# Patient Record
Sex: Male | Born: 1960 | Race: Black or African American | Hispanic: No | Marital: Married | Smoking: Former smoker
Health system: Southern US, Community
[De-identification: ages and names within clinical notes are randomized; demographics above are authoritative.]

## PROBLEM LIST (undated history)

## (undated) DIAGNOSIS — K219 Gastro-esophageal reflux disease without esophagitis: Secondary | ICD-10-CM

## (undated) DIAGNOSIS — T7840XA Allergy, unspecified, initial encounter: Secondary | ICD-10-CM

## (undated) DIAGNOSIS — M79601 Pain in right arm: Secondary | ICD-10-CM

## (undated) DIAGNOSIS — I10 Essential (primary) hypertension: Secondary | ICD-10-CM

## (undated) DIAGNOSIS — E7800 Pure hypercholesterolemia, unspecified: Secondary | ICD-10-CM

## (undated) DIAGNOSIS — R51 Headache: Secondary | ICD-10-CM

## (undated) DIAGNOSIS — M549 Dorsalgia, unspecified: Secondary | ICD-10-CM

## (undated) DIAGNOSIS — M1990 Unspecified osteoarthritis, unspecified site: Secondary | ICD-10-CM

## (undated) HISTORY — DX: Allergy, unspecified, initial encounter: T78.40XA

## (undated) HISTORY — DX: Pure hypercholesterolemia, unspecified: E78.00

## (undated) HISTORY — DX: Gastro-esophageal reflux disease without esophagitis: K21.9

## (undated) HISTORY — PX: HAND ARTHROPLASTY: SHX968

---

## 2011-06-21 ENCOUNTER — Encounter: Payer: Self-pay | Admitting: Emergency Medicine

## 2011-06-21 ENCOUNTER — Emergency Department (HOSPITAL_COMMUNITY)
Admission: EM | Admit: 2011-06-21 | Discharge: 2011-06-22 | Disposition: A | Discharge status: Home / Self Care | Payer: Medicaid Other | Attending: Emergency Medicine | Admitting: Emergency Medicine

## 2011-06-21 DIAGNOSIS — M25519 Pain in unspecified shoulder: Secondary | ICD-10-CM | POA: Insufficient documentation

## 2011-06-21 DIAGNOSIS — Z9889 Other specified postprocedural states: Secondary | ICD-10-CM | POA: Insufficient documentation

## 2011-06-21 DIAGNOSIS — Z79899 Other long term (current) drug therapy: Secondary | ICD-10-CM | POA: Insufficient documentation

## 2011-06-21 DIAGNOSIS — F17200 Nicotine dependence, unspecified, uncomplicated: Secondary | ICD-10-CM | POA: Insufficient documentation

## 2011-06-21 NOTE — ED Notes (Signed)
PT. REPORTS RIGH SHOULDER PAIN /RIGHT UPPER BACK PAIN / RIGHT ARM PAIN ONSET YESTERDAY , DENIES INJURY OR FALL , PAIN WORSE WHEN MOVING AND CERTAIN POSITIONS.

## 2011-06-22 ENCOUNTER — Emergency Department (HOSPITAL_COMMUNITY): Payer: Medicaid Other

## 2011-06-22 MED ORDER — PREDNISONE 10 MG PO TABS: 20.0000 mg | ORAL_TABLET | Freq: Every day | ORAL | Status: AC

## 2011-06-22 MED ORDER — PREDNISONE 20 MG PO TABS
60.0000 mg | ORAL_TABLET | Freq: Once | ORAL | Status: AC
  Administered 2011-06-22: 60 mg via ORAL
  Filled 2011-06-22: qty 3

## 2011-06-22 MED ORDER — OXYCODONE-ACETAMINOPHEN 5-325 MG PO TABS
2.0000 | ORAL_TABLET | Freq: Once | ORAL | Status: AC
  Administered 2011-06-22: 2 via ORAL
  Filled 2011-06-22: qty 2

## 2011-06-22 MED ORDER — ONDANSETRON HCL 4 MG PO TABS: 4.0000 mg | ORAL_TABLET | Freq: Four times a day (QID) | ORAL | Status: AC

## 2011-06-22 MED ORDER — OXYCODONE-ACETAMINOPHEN 5-325 MG PO TABS: 1.0000 | ORAL_TABLET | Freq: Four times a day (QID) | ORAL | Status: AC | PRN

## 2011-06-22 NOTE — ED Provider Notes (Signed)
Medical screening examination/treatment/procedure(s) were performed by non-physician practitioner and as supervising physician I was immediately available for consultation/collaboration.   Gwyneth Sprout, MD 06/22/11 [phone]

## 2011-06-22 NOTE — ED Provider Notes (Signed)
History     CSN: [id number] Arrival date & time: 06/21/2011 11:03 PM   First MD Initiated Contact with Patient 06/22/11 0200      Chief Complaint  Patient presents with  . Shoulder Pain    (Consider location/radiation/quality/duration/timing/severity/associated sxs/prior treatment) The history is provided by the patient.   Pt presents to the ED with severe right shoulder pain. He denies injury, fall or anything else that may have caused this. He hurt his shoulder 6 months ago but it hadnt bothered him much since yesterday. Tonight while he was trying to sleep he began to have shooting pains going down him arm. Denies weakness   History reviewed. No pertinent past medical history.  Past Surgical History  Procedure Date  . Hand arthroplasty     No family history on file.  History  Substance Use Topics  . Smoking status: Current Everyday Smoker  . Smokeless tobacco: Not on file  . Alcohol Use: No      Review of Systems  All other systems reviewed and are negative.    Allergies  Review of patient's allergies indicates no known allergies.  Home Medications   Current Outpatient Rx  Name Route Sig Dispense Refill  . ESOMEPRAZOLE MAGNESIUM 40 MG PO CPDR Oral Take 40 mg by mouth daily before breakfast.        BP 153/101  Pulse 95  Temp(Src) 98.1 F (36.7 C) (Oral)  Resp 20  SpO2 97%  Physical Exam  Nursing note and vitals reviewed. Constitutional: He is oriented to person, place, and time. He appears well-developed and well-nourished.  HENT:  Head: Normocephalic and atraumatic.  Eyes: EOM are normal. Pupils are equal, round, and reactive to light.  Neck: Normal range of motion.  Cardiovascular: Normal rate and regular rhythm.   Pulmonary/Chest: Effort normal and breath sounds normal.  Musculoskeletal: Normal range of motion.       Arms: Neurological: He is alert and oriented to person, place, and time.  Skin: Skin is warm and dry.    ED Course    Procedures (including critical care time)  Labs Reviewed - No data to display Dg Shoulder Right  06/22/2011  *RADIOLOGY REPORT*  Clinical Data: Right shoulder pain.  RIGHT SHOULDER - 2+ VIEW  Comparison: None.  Findings: Mild acromioclavicular DJD.  No acute fracture or dislocation.  Unremarkable right upper ribs, scapula, and clavicle. Right upper lung is clear.  IMPRESSION: No acute osseous abnormality.  Mild acromioclavicular DJD.  Original Report Authenticated By: Waneta Martins, M.D.     No diagnosis found.    MDM   Pt symptoms are consistent with with swelling causisng shooting nerve pains. Will treat with steroids and pain medication and make sure he follows-up with Ortho.       Dorthula Matas, PA 06/22/11 [phone]

## 2011-07-22 ENCOUNTER — Other Ambulatory Visit: Payer: Self-pay | Admitting: Orthopedic Surgery

## 2011-07-25 ENCOUNTER — Encounter (HOSPITAL_COMMUNITY): Payer: Self-pay | Admitting: Pharmacy Technician

## 2011-07-27 ENCOUNTER — Encounter (HOSPITAL_COMMUNITY): Payer: Self-pay

## 2011-07-27 ENCOUNTER — Encounter (HOSPITAL_COMMUNITY)
Admission: RE | Admit: 2011-07-27 | Discharge: 2011-07-27 | Disposition: A | Discharge status: Home / Self Care | Payer: Medicaid Other | Source: Ambulatory Visit | Attending: Orthopedic Surgery | Admitting: Orthopedic Surgery

## 2011-07-27 ENCOUNTER — Other Ambulatory Visit: Payer: Self-pay

## 2011-07-27 HISTORY — DX: Pain in right arm: M79.601

## 2011-07-27 HISTORY — DX: Headache: R51

## 2011-07-27 HISTORY — DX: Dorsalgia, unspecified: M54.9

## 2011-07-27 HISTORY — DX: Unspecified osteoarthritis, unspecified site: M19.90

## 2011-07-27 LAB — URINALYSIS, ROUTINE W REFLEX MICROSCOPIC
Bilirubin Urine: NEGATIVE
Glucose, UA: NEGATIVE mg/dL
Hgb urine dipstick: NEGATIVE
Ketones, ur: NEGATIVE mg/dL
Leukocytes, UA: NEGATIVE
Nitrite: NEGATIVE
Protein, ur: NEGATIVE mg/dL
Specific Gravity, Urine: 1.024 (ref 1.005–1.030)
Urobilinogen, UA: 1 mg/dL (ref 0.0–1.0)
pH: 6 (ref 5.0–8.0)

## 2011-07-27 LAB — COMPREHENSIVE METABOLIC PANEL
ALT: 15 U/L (ref 0–53)
AST: 15 U/L (ref 0–37)
Albumin: 3.9 g/dL (ref 3.5–5.2)
Alkaline Phosphatase: 72 U/L (ref 39–117)
BUN: 10 mg/dL (ref 6–23)
CO2: 26 mEq/L (ref 19–32)
Calcium: 10.1 mg/dL (ref 8.4–10.5)
Chloride: 104 mEq/L (ref 96–112)
Creatinine, Ser: 1.27 mg/dL (ref 0.50–1.35)
GFR calc Af Amer: 74 mL/min — ABNORMAL LOW (ref >90)
GFR calc non Af Amer: 64 mL/min — ABNORMAL LOW (ref >90)
Glucose, Bld: 95 mg/dL (ref 70–99)
Potassium: 4 mEq/L (ref 3.5–5.1)
Sodium: 140 mEq/L (ref 135–145)
Total Bilirubin: 0.3 mg/dL (ref 0.3–1.2)
Total Protein: 7.4 g/dL (ref 6.0–8.3)

## 2011-07-27 LAB — CBC
HCT: 39.4 % (ref 39.0–52.0)
Hemoglobin: 13.8 g/dL (ref 13.0–17.0)
MCH: 28.7 pg (ref 26.0–34.0)
MCHC: 35 g/dL (ref 30.0–36.0)
MCV: 81.9 fL (ref 78.0–100.0)
Platelets: 290 10*3/uL (ref 150–400)
RBC: 4.81 MIL/uL (ref 4.22–5.81)
RDW: 14 % (ref 11.5–15.5)
WBC: 5.7 10*3/uL (ref 4.0–10.5)

## 2011-07-27 LAB — TYPE AND SCREEN
ABO/RH(D): O POS
Antibody Screen: NEGATIVE

## 2011-07-27 LAB — PROTIME-INR
INR: 1 (ref 0.00–1.49)
Prothrombin Time: 13.4 seconds (ref 11.6–15.2)

## 2011-07-27 LAB — DIFFERENTIAL
Basophils Absolute: 0 10*3/uL (ref 0.0–0.1)
Basophils Relative: 0 % (ref 0–1)
Eosinophils Absolute: 0.1 10*3/uL (ref 0.0–0.7)
Eosinophils Relative: 2 % (ref 0–5)
Lymphocytes Relative: 47 % — ABNORMAL HIGH (ref 12–46)
Lymphs Abs: 2.7 10*3/uL (ref 0.7–4.0)
Monocytes Absolute: 0.6 10*3/uL (ref 0.1–1.0)
Monocytes Relative: 11 % (ref 3–12)
Neutro Abs: 2.3 10*3/uL (ref 1.7–7.7)
Neutrophils Relative %: 40 % — ABNORMAL LOW (ref 43–77)

## 2011-07-27 LAB — APTT: aPTT: 33 seconds (ref 24–37)

## 2011-07-27 LAB — SURGICAL PCR SCREEN
MRSA, PCR: NEGATIVE
Staphylococcus aureus: POSITIVE — AB

## 2011-07-27 LAB — ABO/RH: ABO/RH(D): O POS

## 2011-07-27 MED ORDER — CEFAZOLIN SODIUM-DEXTROSE 2-3 GM-% IV SOLR
2.0000 g | INTRAVENOUS | Status: AC
  Administered 2011-07-28: 2 g via INTRAVENOUS
  Filled 2011-07-27: qty 50

## 2011-07-27 MED ORDER — POVIDONE-IODINE 7.5 % EX SOLN
Freq: Once | CUTANEOUS | Status: DC
  Filled 2011-07-27: qty 118

## 2011-07-27 NOTE — Pre-Procedure Instructions (Addendum)
20 Daniel Kemp  07/27/2011   Your procedure is scheduled on:  Thursday, December 27  Report to [facility] at 1:30 PM.  Call this number if you have problems the morning of surgery: [phone]   Remember:   Do not eat food:After Midnight.  May have clear liquids: up to 4 Hours before arrival.  Clear liquids include soda, tea, black coffee, apple or grape juice, broth.  Take these medicines the morning of surgery with A SIP OF WATER: Nexium, prednisolone, pain pill   Do not wear jewelry, make-up or nail polish.  Do not wear lotions, powders, or perfumes. You may wear deodorant.  Do not shave 48 hours prior to surgery.  Do not bring valuables to the hospital.  Contacts, dentures or bridgework may not be worn into surgery.  Leave suitcase in the car. After surgery it may be brought to your room.  For patients admitted to the hospital, checkout time is 11:00 AM the day of discharge.   Patients discharged the day of surgery will not be allowed to drive home.  Name and phone number of your driver: NA  Special Instructions: Incentive Spirometry - Practice and bring it with you on the day of surgery. and CHG Shower Use Special Wash: 1/2 bottle night before surgery and 1/2 bottle morning of surgery.   Please read over the following fact sheets that you were given: Pain Booklet, Coughing and Deep Breathing, Blood Transfusion Information and Surgical Site Infection Prevention

## 2011-07-28 ENCOUNTER — Inpatient Hospital Stay (HOSPITAL_COMMUNITY): Payer: Medicaid Other | Admitting: Anesthesiology

## 2011-07-28 ENCOUNTER — Encounter (HOSPITAL_COMMUNITY)
Admission: RE | Disposition: A | Discharge status: Home / Self Care | Payer: Self-pay | Source: Ambulatory Visit | Attending: Orthopedic Surgery

## 2011-07-28 ENCOUNTER — Encounter (HOSPITAL_COMMUNITY): Payer: Self-pay | Admitting: *Deleted

## 2011-07-28 ENCOUNTER — Encounter (HOSPITAL_COMMUNITY): Payer: Self-pay | Admitting: Anesthesiology

## 2011-07-28 ENCOUNTER — Inpatient Hospital Stay (HOSPITAL_COMMUNITY)
Admission: RE | Admit: 2011-07-28 | Discharge: 2011-07-29 | DRG: 473 | Disposition: A | Discharge status: Home / Self Care | Payer: Medicaid Other | Source: Ambulatory Visit | Attending: Orthopedic Surgery | Admitting: Orthopedic Surgery

## 2011-07-28 ENCOUNTER — Inpatient Hospital Stay (HOSPITAL_COMMUNITY): Payer: Medicaid Other

## 2011-07-28 DIAGNOSIS — M129 Arthropathy, unspecified: Secondary | ICD-10-CM | POA: Diagnosis present

## 2011-07-28 DIAGNOSIS — M5030 Other cervical disc degeneration, unspecified cervical region: Principal | ICD-10-CM | POA: Diagnosis present

## 2011-07-28 DIAGNOSIS — F17200 Nicotine dependence, unspecified, uncomplicated: Secondary | ICD-10-CM | POA: Diagnosis present

## 2011-07-28 HISTORY — PX: ANTERIOR CERVICAL DECOMP/DISCECTOMY FUSION: SHX1161

## 2011-07-28 SURGERY — ANTERIOR CERVICAL DECOMPRESSION/DISCECTOMY FUSION 2 LEVELS
Anesthesia: General | Site: Spine Cervical | Laterality: Right | Wound class: Clean

## 2011-07-28 MED ORDER — LORAZEPAM 2 MG/ML IJ SOLN
1.0000 mg | Freq: Once | INTRAMUSCULAR | Status: AC | PRN
  Administered 2011-07-28: 1 mg via INTRAVENOUS

## 2011-07-28 MED ORDER — MIDAZOLAM HCL 2 MG/2ML IJ SOLN: 1.0000 mg | INTRAMUSCULAR | Status: DC | PRN

## 2011-07-28 MED ORDER — DROPERIDOL 2.5 MG/ML IJ SOLN
INTRAMUSCULAR | Status: DC | PRN
  Administered 2011-07-28: 0.625 mg via INTRAVENOUS

## 2011-07-28 MED ORDER — MIDAZOLAM HCL 5 MG/5ML IJ SOLN
INTRAMUSCULAR | Status: DC | PRN
  Administered 2011-07-28: 2 mg via INTRAVENOUS

## 2011-07-28 MED ORDER — SODIUM CHLORIDE 0.9 % IJ SOLN: 3.0000 mL | Freq: Two times a day (BID) | INTRAMUSCULAR | Status: DC

## 2011-07-28 MED ORDER — THROMBIN 20000 UNITS EX KIT
PACK | CUTANEOUS | Status: DC | PRN
  Administered 2011-07-28: 15:00:00 via TOPICAL

## 2011-07-28 MED ORDER — POTASSIUM CHLORIDE IN NACL 20-0.9 MEQ/L-% IV SOLN
INTRAVENOUS | Status: DC
  Filled 2011-07-28 (×3): qty 1000

## 2011-07-28 MED ORDER — CEFAZOLIN SODIUM 1-5 GM-% IV SOLN
1.0000 g | Freq: Three times a day (TID) | INTRAVENOUS | Status: AC
  Administered 2011-07-28 – 2011-07-29 (×2): 1 g via INTRAVENOUS
  Filled 2011-07-28 (×2): qty 50

## 2011-07-28 MED ORDER — MENTHOL 3 MG MT LOZG: 1.0000 | LOZENGE | OROMUCOSAL | Status: DC | PRN

## 2011-07-28 MED ORDER — CHLORHEXIDINE GLUCONATE CLOTH 2 % EX PADS
6.0000 | MEDICATED_PAD | Freq: Every day | CUTANEOUS | Status: DC
  Administered 2011-07-29: 1 via TOPICAL

## 2011-07-28 MED ORDER — MUPIROCIN 2 % EX OINT
TOPICAL_OINTMENT | Freq: Once | CUTANEOUS | Status: AC
  Administered 2011-07-28: 22:00:00 via NASAL

## 2011-07-28 MED ORDER — PHENOL 1.4 % MT LIQD: 1.0000 | OROMUCOSAL | Status: DC | PRN

## 2011-07-28 MED ORDER — GLYCOPYRROLATE 0.2 MG/ML IJ SOLN
INTRAMUSCULAR | Status: DC | PRN
  Administered 2011-07-28: .8 mg via INTRAVENOUS

## 2011-07-28 MED ORDER — HYDROMORPHONE HCL PF 1 MG/ML IJ SOLN
0.2500 mg | INTRAMUSCULAR | Status: DC | PRN
  Administered 2011-07-28: 0.5 mg via INTRAVENOUS

## 2011-07-28 MED ORDER — HYDROXYZINE HCL 25 MG PO TABS: 50.0000 mg | ORAL_TABLET | ORAL | Status: DC | PRN

## 2011-07-28 MED ORDER — NEOSTIGMINE METHYLSULFATE 1 MG/ML IJ SOLN
INTRAMUSCULAR | Status: DC | PRN
  Administered 2011-07-28: 5 mg via INTRAVENOUS

## 2011-07-28 MED ORDER — 0.9 % SODIUM CHLORIDE (POUR BTL) OPTIME
TOPICAL | Status: DC | PRN
  Administered 2011-07-28: 1000 mL

## 2011-07-28 MED ORDER — PROMETHAZINE HCL 25 MG/ML IJ SOLN: 6.2500 mg | INTRAMUSCULAR | Status: DC | PRN

## 2011-07-28 MED ORDER — SODIUM CHLORIDE 0.9 % IJ SOLN: 3.0000 mL | INTRAMUSCULAR | Status: DC | PRN

## 2011-07-28 MED ORDER — ROCURONIUM BROMIDE 100 MG/10ML IV SOLN
INTRAVENOUS | Status: DC | PRN
  Administered 2011-07-28 (×2): 10 mg via INTRAVENOUS
  Administered 2011-07-28: 50 mg via INTRAVENOUS
  Administered 2011-07-28 (×3): 10 mg via INTRAVENOUS
  Administered 2011-07-28: 20 mg via INTRAVENOUS
  Administered 2011-07-28: 10 mg via INTRAVENOUS

## 2011-07-28 MED ORDER — DIAZEPAM 5 MG PO TABS
5.0000 mg | ORAL_TABLET | Freq: Four times a day (QID) | ORAL | Status: DC | PRN
  Administered 2011-07-28 – 2011-07-29 (×2): 5 mg via ORAL
  Filled 2011-07-28 (×2): qty 1

## 2011-07-28 MED ORDER — LACTATED RINGERS IV SOLN
INTRAVENOUS | Status: DC
  Administered 2011-07-28 (×2): via INTRAVENOUS

## 2011-07-28 MED ORDER — FENTANYL CITRATE 0.05 MG/ML IJ SOLN
INTRAMUSCULAR | Status: DC | PRN
  Administered 2011-07-28 (×2): 50 ug via INTRAVENOUS
  Administered 2011-07-28 (×2): 100 ug via INTRAVENOUS
  Administered 2011-07-28 (×2): 50 ug via INTRAVENOUS

## 2011-07-28 MED ORDER — ACETAMINOPHEN 650 MG RE SUPP: 650.0000 mg | RECTAL | Status: DC | PRN

## 2011-07-28 MED ORDER — PROPOFOL 10 MG/ML IV EMUL
INTRAVENOUS | Status: DC | PRN
  Administered 2011-07-28: 300 mg via INTRAVENOUS
  Administered 2011-07-28: 100 mg via INTRAVENOUS

## 2011-07-28 MED ORDER — LORAZEPAM 2 MG/ML IJ SOLN
INTRAMUSCULAR | Status: AC
  Filled 2011-07-28: qty 1

## 2011-07-28 MED ORDER — OXYCODONE-ACETAMINOPHEN 5-325 MG PO TABS
1.0000 | ORAL_TABLET | ORAL | Status: DC | PRN
  Administered 2011-07-28 – 2011-07-29 (×2): 2 via ORAL
  Filled 2011-07-28 (×2): qty 2

## 2011-07-28 MED ORDER — MORPHINE SULFATE 4 MG/ML IJ SOLN: 2.0000 mg | INTRAMUSCULAR | Status: DC | PRN

## 2011-07-28 MED ORDER — LACTATED RINGERS IV SOLN
INTRAVENOUS | Status: DC | PRN
  Administered 2011-07-28 (×2): via INTRAVENOUS

## 2011-07-28 MED ORDER — MUPIROCIN 2 % EX OINT
TOPICAL_OINTMENT | CUTANEOUS | Status: AC
  Administered 2011-07-28: 13:00:00
  Filled 2011-07-28: qty 22

## 2011-07-28 MED ORDER — DEXAMETHASONE SODIUM PHOSPHATE 4 MG/ML IJ SOLN
INTRAMUSCULAR | Status: DC | PRN
  Administered 2011-07-28: 8 mg via INTRAVENOUS

## 2011-07-28 MED ORDER — PANTOPRAZOLE SODIUM 40 MG PO TBEC: 40.0000 mg | DELAYED_RELEASE_TABLET | Freq: Every day | ORAL | Status: DC

## 2011-07-28 MED ORDER — FENTANYL CITRATE 0.05 MG/ML IJ SOLN: 50.0000 ug | INTRAMUSCULAR | Status: DC | PRN

## 2011-07-28 MED ORDER — ACETAMINOPHEN 325 MG PO TABS: 650.0000 mg | ORAL_TABLET | ORAL | Status: DC | PRN

## 2011-07-28 MED ORDER — BUPIVACAINE-EPINEPHRINE 0.25% -1:200000 IJ SOLN
INTRAMUSCULAR | Status: DC | PRN
  Administered 2011-07-28: 2 mL

## 2011-07-28 MED ORDER — MUPIROCIN CALCIUM 2 % EX CREA
TOPICAL_CREAM | Freq: Two times a day (BID) | CUTANEOUS | Status: DC
  Administered 2011-07-28 – 2011-07-29 (×2): via TOPICAL
  Filled 2011-07-28: qty 15

## 2011-07-28 MED ORDER — ONDANSETRON HCL 4 MG/2ML IJ SOLN
INTRAMUSCULAR | Status: DC | PRN
  Administered 2011-07-28: 4 mg via INTRAVENOUS

## 2011-07-28 MED ORDER — ZOLPIDEM TARTRATE 5 MG PO TABS: 5.0000 mg | ORAL_TABLET | Freq: Every evening | ORAL | Status: DC | PRN

## 2011-07-28 MED ORDER — DEXTROSE 5 % IV SOLN
INTRAVENOUS | Status: DC | PRN
  Administered 2011-07-28: 14:00:00 via INTRAVENOUS

## 2011-07-28 MED ORDER — HYDROXYZINE HCL 50 MG/ML IM SOLN: 50.0000 mg | INTRAMUSCULAR | Status: DC | PRN

## 2011-07-28 SURGICAL SUPPLY — 85 items
BENZOIN TINCTURE PRP APPL 2/3 (GAUZE/BANDAGES/DRESSINGS) ×3 IMPLANT
BIT DRILL NEURO 2X3.1 SFT TUCH (MISCELLANEOUS) ×2 IMPLANT
BLADE LONG MED 31X9 (MISCELLANEOUS) IMPLANT
BLADE SURG 15 STRL LF DISP TIS (BLADE) ×2 IMPLANT
BLADE SURG 15 STRL SS (BLADE) ×1
BLADE SURG ROTATE 9660 (MISCELLANEOUS) IMPLANT
BUR NEURO DRILL SOFT 3.0X3.8M (BURR) ×3 IMPLANT
CARTRIDGE OIL MAESTRO DRILL (MISCELLANEOUS) ×2 IMPLANT
CERVICAL PARALLEL MED 7MM (Bone Implant) ×3 IMPLANT
CLOTH BEACON ORANGE TIMEOUT ST (SAFETY) ×3 IMPLANT
COLLAR CERV LO CONTOUR FIRM DE (SOFTGOODS) IMPLANT
CORDS BIPOLAR (ELECTRODE) ×3 IMPLANT
COVER SURGICAL LIGHT HANDLE (MISCELLANEOUS) ×3 IMPLANT
CRADLE DONUT ADULT HEAD (MISCELLANEOUS) ×3 IMPLANT
DEVICE ENDSKLTN MED 6 7MM (Orthopedic Implant) ×2 IMPLANT
DIFFUSER DRILL AIR PNEUMATIC (MISCELLANEOUS) ×3 IMPLANT
DRAIN JACKSON RD 7FR 3/32 (WOUND CARE) IMPLANT
DRAPE C-ARM 42X72 X-RAY (DRAPES) ×3 IMPLANT
DRAPE POUCH INSTRU U-SHP 10X18 (DRAPES) ×3 IMPLANT
DRAPE SURG 17X23 STRL (DRAPES) ×9 IMPLANT
DRILL NEURO 2X3.1 SOFT TOUCH (MISCELLANEOUS) ×3
DURAPREP 6ML APPLICATOR 50/CS (WOUND CARE) ×3 IMPLANT
ELECT COATED BLADE 2.86 ST (ELECTRODE) ×3 IMPLANT
ELECT REM PT RETURN 9FT ADLT (ELECTROSURGICAL) ×3
ELECTRODE REM PT RTRN 9FT ADLT (ELECTROSURGICAL) ×2 IMPLANT
ENDOSKELETON MED 6 7MM (Orthopedic Implant) ×3 IMPLANT
EVACUATOR SILICONE 100CC (DRAIN) ×3 IMPLANT
GAUZE SPONGE 4X4 12PLY STRL LF (GAUZE/BANDAGES/DRESSINGS) ×3 IMPLANT
GAUZE SPONGE 4X4 16PLY XRAY LF (GAUZE/BANDAGES/DRESSINGS) ×3 IMPLANT
GLOVE BIO SURGEON STRL SZ7 (GLOVE) ×3 IMPLANT
GLOVE BIO SURGEON STRL SZ8 (GLOVE) ×3 IMPLANT
GLOVE BIO SURGEON STRL SZ8.5 (GLOVE) ×3 IMPLANT
GLOVE BIOGEL M 7.0 STRL (GLOVE) ×3 IMPLANT
GLOVE BIOGEL PI IND STRL 6.5 (GLOVE) ×2 IMPLANT
GLOVE BIOGEL PI IND STRL 7.5 (GLOVE) ×2 IMPLANT
GLOVE BIOGEL PI IND STRL 8 (GLOVE) ×2 IMPLANT
GLOVE BIOGEL PI IND STRL 8.5 (GLOVE) ×2 IMPLANT
GLOVE BIOGEL PI INDICATOR 6.5 (GLOVE) ×1
GLOVE BIOGEL PI INDICATOR 7.5 (GLOVE) ×1
GLOVE BIOGEL PI INDICATOR 8 (GLOVE) ×1
GLOVE BIOGEL PI INDICATOR 8.5 (GLOVE) ×1
GLOVE BIOGEL PI ORTHO PRO SZ7 (GLOVE) ×1
GLOVE PI ORTHO PRO STRL SZ7 (GLOVE) ×2 IMPLANT
GLOVE SS BIOGEL STRL SZ 6.5 (GLOVE) ×2 IMPLANT
GLOVE SUPERSENSE BIOGEL SZ 6.5 (GLOVE) ×1
GLOVE SURG SS PI 7.0 STRL IVOR (GLOVE) ×3 IMPLANT
GOWN PREVENTION PLUS XLARGE (GOWN DISPOSABLE) ×9 IMPLANT
GOWN STRL NON-REIN LRG LVL3 (GOWN DISPOSABLE) ×3 IMPLANT
IV CATH 14GX2 1/4 (CATHETERS) ×3 IMPLANT
KIT BASIN OR (CUSTOM PROCEDURE TRAY) ×3 IMPLANT
KIT ROOM TURNOVER OR (KITS) ×3 IMPLANT
MANIFOLD NEPTUNE II (INSTRUMENTS) ×3 IMPLANT
NEEDLE 27GAX1X1/2 (NEEDLE) ×3 IMPLANT
NEEDLE SPNL 20GX3.5 QUINCKE YW (NEEDLE) ×6 IMPLANT
NS IRRIG 1000ML POUR BTL (IV SOLUTION) ×3 IMPLANT
OIL CARTRIDGE MAESTRO DRILL (MISCELLANEOUS) ×3
PACK ORTHO CERVICAL (CUSTOM PROCEDURE TRAY) ×3 IMPLANT
PAD ARMBOARD 7.5X6 YLW CONV (MISCELLANEOUS) ×6 IMPLANT
PATTIES SURGICAL .5 X.5 (GAUZE/BANDAGES/DRESSINGS) IMPLANT
PATTIES SURGICAL .5 X1 (DISPOSABLE) ×3 IMPLANT
PIN RETAINER PRODISC 14 MM (PIN) ×6 IMPLANT
PLATE VECTRA (Plate) ×3 IMPLANT
PUTTY BONE DBX 2.5 MIS (Bone Implant) ×3 IMPLANT
SCREW 4.0X16MM (Screw) ×18 IMPLANT
SPONGE GAUZE 4X4 12PLY (GAUZE/BANDAGES/DRESSINGS) ×3 IMPLANT
SPONGE INTESTINAL PEANUT (DISPOSABLE) ×9 IMPLANT
SPONGE SURGIFOAM ABS GEL 100 (HEMOSTASIS) ×3 IMPLANT
STRIP CLOSURE SKIN 1/2X4 (GAUZE/BANDAGES/DRESSINGS) ×3 IMPLANT
SURGIFLO TRUKIT (HEMOSTASIS) IMPLANT
SUT ETHILON 3 0 FSL (SUTURE) IMPLANT
SUT MNCRL AB 4-0 PS2 18 (SUTURE) ×3 IMPLANT
SUT PROLENE 3 0 PS 2 (SUTURE) IMPLANT
SUT SILK 4 0 (SUTURE)
SUT SILK 4-0 18XBRD TIE 12 (SUTURE) IMPLANT
SUT VIC AB 2-0 CT2 18 VCP726D (SUTURE) ×3 IMPLANT
SYNTHES SPINE ×6 IMPLANT
SYR BULB IRRIGATION 50ML (SYRINGE) ×3 IMPLANT
SYR CONTROL 10ML LL (SYRINGE) ×6 IMPLANT
TAPE CLOTH 4X10 WHT NS (GAUZE/BANDAGES/DRESSINGS) ×3 IMPLANT
TAPE UMBILICAL COTTON 1/8X30 (MISCELLANEOUS) ×3 IMPLANT
TOWEL OR 17X24 6PK STRL BLUE (TOWEL DISPOSABLE) ×3 IMPLANT
TOWEL OR 17X26 10 PK STRL BLUE (TOWEL DISPOSABLE) ×3 IMPLANT
TRAY FOLEY CATH 14FR (SET/KITS/TRAYS/PACK) IMPLANT
WATER STERILE IRR 1000ML POUR (IV SOLUTION) ×3 IMPLANT
YANKAUER SUCT BULB TIP NO VENT (SUCTIONS) ×3 IMPLANT

## 2011-07-28 NOTE — Transfer of Care (Signed)
Immediate Anesthesia Transfer of Care Note  Patient: Daniel Kemp  Procedure(s) Performed:  ANTERIOR CERVICAL DECOMPRESSION/DISCECTOMY FUSION 2 LEVELS - ACDF C6-7 and C7-T1  Patient Location: PACU  Anesthesia Type: General  Level of Consciousness: sedated  Airway & Oxygen Therapy: Patient connected to nasal cannula oxygen  Post-op Assessment: Report given to PACU RN, Post -op Vital signs reviewed and stable and Patient moving all extremities X 4  Post vital signs: Reviewed and stable  Complications: No apparent anesthesia complications

## 2011-07-28 NOTE — Anesthesia Procedure Notes (Signed)
Procedure Name: Intubation Date/Time: 07/28/2011 2:25 PM Performed by: Tyrone Nine Pre-anesthesia Checklist: Patient being monitored, Suction available, Emergency Drugs available and Patient identified Patient Re-evaluated:Patient Re-evaluated prior to inductionOxygen Delivery Method: Circle System Utilized Preoxygenation: Pre-oxygenation with 100% oxygen Intubation Type: IV induction Ventilation: Mask ventilation without difficulty Laryngoscope Size: Mac and 3 Grade View: Grade II Tube size: 8.5 mm Airway Equipment and Method: stylet Placement Confirmation: ETT inserted through vocal cords under direct vision,  breath sounds checked- equal and bilateral and positive ETCO2 Secured at: 23 cm Tube secured with: Tape Dental Injury: Teeth and Oropharynx as per pre-operative assessment

## 2011-07-28 NOTE — Preoperative (Signed)
Beta Blockers   Reason not to administer Beta Blockers:Not Applicable 

## 2011-07-28 NOTE — H&P (Signed)
PREOPERATIVE H&P  Chief Complaint: right arm pain  HPI: Daniel Kemp is a 50 y.o. male who presents with right arm pain  Past Medical History  Diagnosis Date  . Headache   . Arthritis   . Arm pain, right   . Back pain with radiation    Past Surgical History  Procedure Date  . Hand arthroplasty    History   Social History  . Marital Status: Married    Spouse Name: N/A    Number of Children: N/A  . Years of Education: N/A   Social History Main Topics  . Smoking status: Current Everyday Smoker -- 0.5 packs/day  . Smokeless tobacco: None  . Alcohol Use: No  . Drug Use: No  . Sexually Active:    Other Topics Concern  . None   Social History Narrative  . None   History reviewed. No pertinent family history. Allergies  Allergen Reactions  . Fish-Derived Products Swelling  . Peanut-Containing Drug Products Swelling   Prior to Admission medications   Medication Sig Start Date End Date Taking? Authorizing Provider  esomeprazole (NEXIUM) 40 MG capsule Take 40 mg by mouth daily.    Yes Historical Provider, MD  HYDROcodone-acetaminophen (NORCO) 5-325 MG per tablet Take 1 tablet by mouth every 6 (six) hours as needed. For pain.    Yes Historical Provider, MD  prednisoLONE 5 MG TABS Take 5 mg by mouth See admin instructions. Take as directed (6 day course)    Yes Historical Provider, MD  traMADol (ULTRAM) 50 MG tablet Take 50 mg by mouth every 6 (six) hours as needed. Maximum dose= 8 tablets per day. For pain.    Yes Historical Provider, MD     All other systems have been reviewed and were otherwise negative with the exception of those mentioned in the HPI and as above.  Physical Exam: Filed Vitals:   07/28/11 1249  BP: 124/83  Pulse: 84  Temp: 97.9 F (36.6 C)  Resp: 20    General: Alert, no acute distress Cardiovascular: No pedal edema Respiratory: No cyanosis, no use of accessory musculature GI: No organomegaly, abdomen is soft and non-tender Skin: No  lesions in the area of chief complaint Neurologic: Sensation intact distally Psychiatric: Patient is competent for consent with normal mood and affect Lymphatic: No axillary or cervical lymphadenopathy  MUSCULOSKELETAL: 4/5 finger flexion on right, + spurlings on right  Assessment/Plan: RIGHT ARM PAIN Plan for Procedure(s): ANTERIOR CERVICAL DECOMPRESSION/DISCECTOMY C6-T1   Daniel Kemp 07/28/2011 1:48 PM

## 2011-07-28 NOTE — Anesthesia Postprocedure Evaluation (Signed)
  Anesthesia Post-op Note  Patient: Daniel Kemp  Procedure(s) Performed:  ANTERIOR CERVICAL DECOMPRESSION/DISCECTOMY FUSION 2 LEVELS - ACDF C6-7 and C7-T1  Patient Location: PACU  Anesthesia Type: General  Level of Consciousness: awake and oriented  Airway and Oxygen Therapy: Patient Spontanous Breathing and Patient connected to nasal cannula oxygen  Post-op Pain: mild  Post-op Assessment: Post-op Vital signs reviewed, Patient's Cardiovascular Status Stable, Respiratory Function Stable, Patent Airway, No signs of Nausea or vomiting and Pain level controlled  Post-op Vital Signs: Reviewed and stable  Complications: No apparent anesthesia complications

## 2011-07-28 NOTE — Plan of Care (Signed)
Problem: Consults Goal: Diagnosis - Spinal Surgery Outcome: Completed/Met Date Met:  07/28/11 Cervical Spine Fusion

## 2011-07-28 NOTE — Anesthesia Preprocedure Evaluation (Addendum)
Anesthesia Evaluation  Patient identified by MRN, date of birth, ID band Patient awake    Reviewed: Allergy & Precautions, H&P , NPO status , Patient's Chart, lab work & pertinent test results  Airway Mallampati: II TM Distance: >3 FB     Dental  (+) Teeth Intact and Dental Advisory Given   Pulmonary neg pulmonary ROS,    Pulmonary exam normal       Cardiovascular Exercise Tolerance: Good neg cardio ROS Regular Normal    Neuro/Psych  Headaches,    GI/Hepatic negative GI ROS, Neg liver ROS, GERD-  Medicated and Controlled,  Endo/Other  Negative Endocrine ROS  Renal/GU negative Renal ROS  Genitourinary negative   Musculoskeletal negative musculoskeletal ROS (+)   Abdominal   Peds negative pediatric ROS (+)  Hematology negative hematology ROS (+)   Anesthesia Other Findings   Reproductive/Obstetrics                        Anesthesia Physical Anesthesia Plan  ASA: II  Anesthesia Plan: General   Post-op Pain Management:    Induction: Intravenous  Airway Management Planned: Oral ETT  Additional Equipment:   Intra-op Plan:   Post-operative Plan: Extubation in OR  Informed Consent: I have reviewed the patients History and Physical, chart, labs and discussed the procedure including the risks, benefits and alternatives for the proposed anesthesia with the patient or authorized representative who has indicated his/her understanding and acceptance.   Dental advisory given and Dental Advisory Given  Plan Discussed with: CRNA and Surgeon  Anesthesia Plan Comments:       Anesthesia Quick Evaluation

## 2011-07-29 ENCOUNTER — Encounter (HOSPITAL_COMMUNITY): Payer: Self-pay | Admitting: Orthopedic Surgery

## 2011-07-29 MED ORDER — SODIUM CHLORIDE 0.9 % IV SOLN: 250.0000 mL | INTRAVENOUS | Status: DC

## 2011-07-29 MED ORDER — DIAZEPAM 5 MG PO TABS: 5.0000 mg | ORAL_TABLET | Freq: Four times a day (QID) | ORAL | Status: AC | PRN

## 2011-07-29 MED ORDER — PNEUMOCOCCAL VAC POLYVALENT 25 MCG/0.5ML IJ INJ
0.5000 mL | INJECTION | Freq: Once | INTRAMUSCULAR | Status: AC
  Administered 2011-07-29: 0.5 mL via INTRAMUSCULAR
  Filled 2011-07-29 (×2): qty 0.5

## 2011-07-29 NOTE — Op Note (Signed)
NAME:  Daniel Kemp, Daniel Kemp NO.:  [id number]  MEDICAL RECORD NO.:  [id number]  LOCATION:  3533                         FACILITY:  [facility]  PHYSICIAN:  Estill Bamberg, MD      DATE OF BIRTH:  1961-05-13  DATE OF PROCEDURE:  07/28/2011 DATE OF DISCHARGE:                              OPERATIVE REPORT   PREOP DIAGNOSES: 1. Right-sided cervical radiculopathy. 2. C6-7, C7-T1 degenerative disk disease.  POSTOP DIAGNOSIS: 1. Right-sided cervical radiculopathy. 2. C6-7, C7-T1 degenerative disk disease.  PROCEDURES: 1. C6-7, C7-T1 anterior cervical decompression and fusion. 2. Placement of anterior instrumentation, C6-T1. 3. Insertion of interbody device x2 (7 mm Titan medium interbody     cage). 4. Use of local autograft. 5. Use of morselized allograft (DBX Mix from the [employer]) 6. Intraoperative use of fluoroscopy.  SURGEONS: Estill Bamberg, MD.  ASSISTANT:  Skip Mayer, [facility]  ANESTHESIA:  General endotracheal anesthesia.  COMPLICATIONS:  None.  DISPOSITION:  Stable.  ESTIMATED BLOOD LOSS:  Minimal.  INDICATIONS FOR PROCEDURE:  Briefly, Daniel Kemp is a 50 year old male, who was referred to me with severe and debilitating pain in his right arm.  Did review an MRI which was notable for neural foraminal stenosis on the right side at the C6-7 and C7-T1 levels.  I did also order an EMG which was diagnostic for right-sided C7-C8 radiculopathy.  Patient has symptoms for approximately 6 weeks in addition to weakness.  We therefore did make a decision to go forward with a two-level anterior cervical decompression and fusion.  The patient fully understood the risks and limitations of the procedure as outlined in my preoperative note.  OPERATIVE DETAILS:  On July 28, 2011, the patient was brought to the Surgery, and general endotracheal anesthesia was administered.  The patient was placed supine on a hospital bed, and the neck was  placed in a gentle degree of extension.  SCDs were placed and antibiotics were given.  Time-out procedure was performed.  I then made a left-sided transverse incision centered over the C7 vertebral body.  The plane between the sternocleidomastoid muscle laterally and strap muscles medially was explored, and the anterior cervical spine was readily identified.  I then placed a 20-gauge spinal needle into the C6-7 interspace and a lateral fluoroscopic view did confirm this to be the appropriate level.  I then turned my attention over the C7-T1 level.  I subperiosteally exposed the vertebral bodies of C6-C7 and T1, and a self- retaining shadow line retractor was placed.  I then went forward a diskectomy using a 15 blade knife in addition to a series of curettes and pituitary rongeur and Kerrison punches.  I then placed Caspar pins into the C7 and T1 vertebral bodies and gentle distraction was applied across the interspace.  I then went forward a diskectomy to the posterior longitudinal ligament.  The posterior longitudinal ligament was entered, and I went forward with a thorough decompression out to the right neural foramen.  At the termination of this aspect of the procedure, I was easily able to pass a nerve, hook out the neural foramen on the right side which did confirm adequate decompression of the  exiting C8 nerve.  I then gently prepared the endplates using a high- speed bur and a rasp.  I then went forward placing a series of trials, and I did feel that a parallel 7 mm trial be the most appropriate fit. The tightened interbody trial was then filled with autograft obtained for removing osteophytes anteriorly and with DBX Mix from the [employer].  The interbody device was tamped into position in the usual fashion under distraction.  I then removed the Caspar pin in C1 vertebral body and bone wax was applied and was placed.  I then turned my attention towards  the C6-7 interspace.  A diskectomy was performed in the manner described previously.  Again, I did confirm a thorough decompression by being able to pass a nerve hook out the right neural foramen at the C6-7 level.  A 7 mm lordotic implant was packed with allograft and autograft as previously described and it was tamped into position as previously described.  I was very happy with the final appearance of the implants on both AP and lateral fluoroscopic views.  I then chose a 30 mm Synthes Vectra plate.  A 60 mm self- drilling, self-tapping, variable angle screws were placed into the C6-C7 and T1 vertebral bodies in the usual fashion.  I was very happy with the final press fit of each of the screws.  I then copiously irrigated the wound.  All bleeding was evaluated and controlled using bipolar electrocautery.  I then closed the platysma and subcutaneous layer using 2-0 Vicryl.  The skin was then closed using 4-0 Monocryl.  Benzoin and Steri-Strips were applied followed by sterile dressing.  An Aspen cervical collar was placed, and the patient was awoken from general endotracheal anesthesia and transferred to recovery in stable condition. All instrument counts were correct at the termination of the procedure. Of note, Skip Mayer was my assistant throughout the procedure and aided in essential retraction and suctioning required throughout the surgery.     Estill Bamberg, MD     MD/MEDQ  D:  07/28/2011  T:  07/29/2011  Job:  [id number]  cc:   Dr. Mayford Knife

## 2011-07-29 NOTE — Progress Notes (Signed)
Pt well, arm pain resolved.   AVSS Dressing CDI NV exam c/w baseline  POD #1 after ACDF - d/c home today

## 2011-07-29 NOTE — Discharge Summary (Signed)
NAME:  Daniel Kemp, DY NO.:  [id number]  MEDICAL RECORD NO.:  [id number]  LOCATION:  3533                         FACILITY:  [facility]  PHYSICIAN:  Estill Bamberg, MD      DATE OF BIRTH:  [birth year]  DATE OF ADMISSION:  07/28/2011 DATE OF DISCHARGE:  07/29/2011                              DISCHARGE SUMMARY   ADMISSION DIAGNOSIS:  Right-sided cervical radiculopathy.  DISCHARGE DIAGNOSIS:  Right-sided cervical radiculopathy.  ADMISSION HISTORY:  [name] was initially seen by me with complaints related to pain in his right arm.  I did review an MRI and EMG which is notable for right-sided cervical radiculopathy.  The patient is therefore admitted on July 28, 2011, for operative intervention and procedure in the form of a 2-level ACDF.  HOSPITAL COURSE:  On July 28, 2011, the patient was brought to surgery and underwent a C6-T1 anterior cervical decompression and fusion.  The patient tolerated the procedure well and was transferred to the recovery room in stable condition.  The patient was evaluated by me on the morning of postop day #1.  The patient's right arm pain was resolved.  The patient was comfortable and he was discharged home uneventfully.  DISCHARGE INSTRUCTIONS:  The patient will take Percocet for pain and Valium for spasms.  He will avoid lifting over 10 pounds and will follow up in my office in approximately 2 weeks after surgery.     Estill Bamberg, MD     MD/MEDQ  D:  07/29/2011  T:  07/29/2011  Job:  [id number]

## 2011-10-27 ENCOUNTER — Ambulatory Visit: Payer: Medicaid Other | Attending: Orthopedic Surgery | Admitting: Physical Therapy

## 2011-10-27 DIAGNOSIS — IMO0001 Reserved for inherently not codable concepts without codable children: Secondary | ICD-10-CM | POA: Insufficient documentation

## 2011-10-27 DIAGNOSIS — M2569 Stiffness of other specified joint, not elsewhere classified: Secondary | ICD-10-CM | POA: Insufficient documentation

## 2011-10-27 DIAGNOSIS — M542 Cervicalgia: Secondary | ICD-10-CM | POA: Insufficient documentation

## 2011-11-07 ENCOUNTER — Ambulatory Visit: Payer: Medicaid Other | Attending: Orthopedic Surgery | Admitting: Physical Therapy

## 2011-11-07 DIAGNOSIS — IMO0001 Reserved for inherently not codable concepts without codable children: Secondary | ICD-10-CM | POA: Insufficient documentation

## 2011-11-07 DIAGNOSIS — M542 Cervicalgia: Secondary | ICD-10-CM | POA: Insufficient documentation

## 2011-11-07 DIAGNOSIS — M2569 Stiffness of other specified joint, not elsewhere classified: Secondary | ICD-10-CM | POA: Insufficient documentation

## 2011-11-14 ENCOUNTER — Ambulatory Visit: Payer: Medicaid Other | Admitting: Physical Therapy

## 2011-12-09 ENCOUNTER — Ambulatory Visit: Payer: Medicaid Other | Attending: Orthopedic Surgery | Admitting: Physical Therapy

## 2011-12-09 DIAGNOSIS — M542 Cervicalgia: Secondary | ICD-10-CM | POA: Insufficient documentation

## 2011-12-09 DIAGNOSIS — M2569 Stiffness of other specified joint, not elsewhere classified: Secondary | ICD-10-CM | POA: Insufficient documentation

## 2011-12-09 DIAGNOSIS — IMO0001 Reserved for inherently not codable concepts without codable children: Secondary | ICD-10-CM | POA: Insufficient documentation

## 2012-10-09 ENCOUNTER — Emergency Department (HOSPITAL_COMMUNITY): Payer: Medicaid Other

## 2012-10-09 ENCOUNTER — Encounter (HOSPITAL_COMMUNITY): Payer: Self-pay | Admitting: Emergency Medicine

## 2012-10-09 ENCOUNTER — Emergency Department (HOSPITAL_COMMUNITY)
Admission: EM | Admit: 2012-10-09 | Discharge: 2012-10-09 | Disposition: A | Discharge status: Home / Self Care | Payer: Medicaid Other | Attending: Emergency Medicine | Admitting: Emergency Medicine

## 2012-10-09 DIAGNOSIS — R059 Cough, unspecified: Secondary | ICD-10-CM | POA: Insufficient documentation

## 2012-10-09 DIAGNOSIS — Z791 Long term (current) use of non-steroidal anti-inflammatories (NSAID): Secondary | ICD-10-CM | POA: Insufficient documentation

## 2012-10-09 DIAGNOSIS — R05 Cough: Secondary | ICD-10-CM | POA: Insufficient documentation

## 2012-10-09 DIAGNOSIS — R197 Diarrhea, unspecified: Secondary | ICD-10-CM | POA: Insufficient documentation

## 2012-10-09 DIAGNOSIS — R109 Unspecified abdominal pain: Secondary | ICD-10-CM | POA: Insufficient documentation

## 2012-10-09 DIAGNOSIS — M129 Arthropathy, unspecified: Secondary | ICD-10-CM | POA: Insufficient documentation

## 2012-10-09 DIAGNOSIS — F17200 Nicotine dependence, unspecified, uncomplicated: Secondary | ICD-10-CM | POA: Insufficient documentation

## 2012-10-09 DIAGNOSIS — Z79899 Other long term (current) drug therapy: Secondary | ICD-10-CM | POA: Insufficient documentation

## 2012-10-09 DIAGNOSIS — J329 Chronic sinusitis, unspecified: Secondary | ICD-10-CM

## 2012-10-09 LAB — BASIC METABOLIC PANEL
BUN: 12 mg/dL (ref 6–23)
CO2: 23 mEq/L (ref 19–32)
Calcium: 8.9 mg/dL (ref 8.4–10.5)
Chloride: 106 mEq/L (ref 96–112)
Creatinine, Ser: 1.19 mg/dL (ref 0.50–1.35)
GFR calc Af Amer: 80 mL/min — ABNORMAL LOW (ref >90)
GFR calc non Af Amer: 69 mL/min — ABNORMAL LOW (ref >90)
Glucose, Bld: 92 mg/dL (ref 70–99)
Potassium: 3.8 mEq/L (ref 3.5–5.1)
Sodium: 138 mEq/L (ref 135–145)

## 2012-10-09 LAB — CBC WITH DIFFERENTIAL/PLATELET
Basophils Absolute: 0 10*3/uL (ref 0.0–0.1)
Basophils Relative: 0 % (ref 0–1)
Eosinophils Absolute: 0.2 10*3/uL (ref 0.0–0.7)
Eosinophils Relative: 3 % (ref 0–5)
HCT: 41 % (ref 39.0–52.0)
Hemoglobin: 14.7 g/dL (ref 13.0–17.0)
Lymphocytes Relative: 39 % (ref 12–46)
Lymphs Abs: 1.9 10*3/uL (ref 0.7–4.0)
MCH: 29.1 pg (ref 26.0–34.0)
MCHC: 35.9 g/dL (ref 30.0–36.0)
MCV: 81 fL (ref 78.0–100.0)
Monocytes Absolute: 0.9 10*3/uL (ref 0.1–1.0)
Monocytes Relative: 19 % — ABNORMAL HIGH (ref 3–12)
Neutro Abs: 1.8 10*3/uL (ref 1.7–7.7)
Neutrophils Relative %: 38 % — ABNORMAL LOW (ref 43–77)
Platelets: 268 10*3/uL (ref 150–400)
RBC: 5.06 MIL/uL (ref 4.22–5.81)
RDW: 14.6 % (ref 11.5–15.5)
WBC: 4.8 10*3/uL (ref 4.0–10.5)

## 2012-10-09 MED ORDER — DIPHENOXYLATE-ATROPINE 2.5-0.025 MG PO TABS: 1.0000 | ORAL_TABLET | Freq: Four times a day (QID) | ORAL | Status: DC | PRN

## 2012-10-09 MED ORDER — LEVOFLOXACIN 500 MG PO TABS
500.0000 mg | ORAL_TABLET | Freq: Once | ORAL | Status: AC
  Administered 2012-10-09: 500 mg via ORAL
  Filled 2012-10-09 (×2): qty 1

## 2012-10-09 MED ORDER — DIPHENOXYLATE-ATROPINE 2.5-0.025 MG PO TABS
2.0000 | ORAL_TABLET | ORAL | Status: AC
  Administered 2012-10-09: 2 via ORAL
  Filled 2012-10-09: qty 2

## 2012-10-09 MED ORDER — LEVOFLOXACIN 500 MG PO TABS: 500.0000 mg | ORAL_TABLET | Freq: Every day | ORAL | Status: DC

## 2012-10-09 NOTE — ED Notes (Signed)
Patient transported to X-ray 

## 2012-10-09 NOTE — ED Notes (Signed)
Onset 2 weeks ago general abdominal pain with loose stool, dry cough, and nasal congestion.

## 2012-10-09 NOTE — ED Provider Notes (Signed)
History     CSN: [id number]  Arrival date & time 10/09/12  1018   First MD Initiated Contact with Patient 10/09/12 1103      Chief Complaint  Patient presents with  . Nasal Congestion  . Cough  . Abdominal Pain    (Consider location/radiation/quality/duration/timing/severity/associated sxs/prior treatment) HPI Comments: 52 year old male with a history of no significant medical problems who presents with recurrent nasal discharge and drainage, bringing up purulent sputum and phlegm which he feels down his nose. He also admits to having sinus pressure and mild abdominal cramping associated with watery diarrhea for the last 2 weeks. There is no blood in his stools, he has no nausea or vomiting and has been able to continue deep normal meals though it is not as much of an appetite. He denies weight loss, travel, trauma, recent antibiotic use or exposure to sick people although he is around children everyday. He works for the school system  Patient is a 52 y.o. male presenting with cough and abdominal pain. The history is provided by the patient.  Cough Abdominal Pain Associated symptoms: cough     Past Medical History  Diagnosis Date  . Headache   . Arthritis   . Arm pain, right   . Back pain with radiation     Past Surgical History  Procedure Laterality Date  . Hand arthroplasty    . Anterior cervical decomp/discectomy fusion  07/28/2011    Procedure: ANTERIOR CERVICAL DECOMPRESSION/DISCECTOMY FUSION 2 LEVELS;  Surgeon: Jeffie Pollock [facility];  Location: [facility] OR;  Service: Orthopedics;  Laterality: N/A;  ACDF C6-7 and C7-T1    No family history on file.  History  Substance Use Topics  . Smoking status: Current Every Day Smoker -- 0.50 packs/day  . Smokeless tobacco: Not on file  . Alcohol Use: No      Review of Systems  Respiratory: Positive for cough.   Gastrointestinal: Positive for abdominal pain.  All other systems reviewed and are negative.    Allergies   Fish-derived products and Peanut-containing drug products  Home Medications   Current Outpatient Rx  Name  Route  Sig  Dispense  Refill  . cyclobenzaprine (FLEXERIL) 5 MG tablet   Oral   Take 5 mg by mouth 3 (three) times daily as needed for muscle spasms.         . lansoprazole (PREVACID) 15 MG capsule   Oral   Take 15 mg by mouth daily as needed (for acid reflux).         . meloxicam (MOBIC) 15 MG tablet   Oral   Take 15 mg by mouth daily.         . Multiple Vitamin (MULTIVITAMIN WITH MINERALS) TABS   Oral   Take 1 tablet by mouth daily.         Marland Kitchen omeprazole (PRILOSEC) 20 MG capsule   Oral   Take 20 mg by mouth daily.         . traMADol (ULTRAM) 50 MG tablet   Oral   Take 50 mg by mouth every 6 (six) hours as needed for pain.         . diphenoxylate-atropine (LOMOTIL) 2.5-0.025 MG per tablet   Oral   Take 1 tablet by mouth 4 (four) times daily as needed for diarrhea or loose stools.   30 tablet   0   . levofloxacin (LEVAQUIN) 500 MG tablet   Oral   Take 1 tablet (500 mg total) by mouth daily.  7 tablet   0     BP 116/84  Pulse 81  Temp(Src) 97.8 F (36.6 C) (Oral)  Resp 18  Ht 6\' 4"  (1.93 m)  Wt 215 lb (97.523 kg)  BMI 26.18 kg/m2  SpO2 98%  Physical Exam  Nursing note and vitals reviewed. Constitutional: He appears well-developed and well-nourished. No distress.  HENT:  Head: Normocephalic and atraumatic.  Mouth/Throat: No oropharyngeal exudate.  The patient has right nostril per and swelling as well as purulent discharge, mild tenderness over the right maxillary sinus, oropharynx is clear with evidence of purulent material dripping down the posterior pharynx  Eyes: Conjunctivae and EOM are normal. Pupils are equal, round, and reactive to light. Right eye exhibits no discharge. Left eye exhibits no discharge. No scleral icterus.  Neck: Normal range of motion. Neck supple. No JVD present. No thyromegaly present.  Very supple neck, no  lymphadenopathy, no trismus, no torticollis  Cardiovascular: Normal rate, regular rhythm, normal heart sounds and intact distal pulses.  Exam reveals no gallop and no friction rub.   No murmur heard. Pulmonary/Chest: Effort normal and breath sounds normal. No respiratory distress. He has no wheezes. He has no rales.  Abdominal: Soft. Bowel sounds are normal. He exhibits no distension and no mass. There is tenderness ( Mild midabdominal tenderness, no guarding, no hepatosplenomegaly, no peritoneal signs, normal bowel sounds).  Musculoskeletal: Normal range of motion. He exhibits no edema and no tenderness.  Lymphadenopathy:    He has no cervical adenopathy.  Neurological: He is alert. Coordination normal.  Skin: Skin is warm and dry. No rash noted. No erythema.  Psychiatric: He has a normal mood and affect. His behavior is normal.    ED Course  Procedures (including critical care time)  Labs Reviewed  CBC WITH DIFFERENTIAL - Abnormal; Notable for the following:    Neutrophils Relative 38 (*)    Monocytes Relative 19 (*)    All other components within normal limits  BASIC METABOLIC PANEL - Abnormal; Notable for the following:    GFR calc non Af Amer 69 (*)    GFR calc Af Amer 80 (*)    All other components within normal limits   Dg Chest 2 View  10/09/2012  *RADIOLOGY REPORT*  Clinical Data: Cough and nasal congestion  CHEST - 2 VIEW  Comparison:  July 27, 2011  Findings:  Lungs clear.  Heart size and pulmonary vascularity are normal.  No adenopathy.  There is degenerative change in the thoracic spine.  There is postoperative change in the lower cervical spine.  IMPRESSION: No edema or consolidation.   Original Report Authenticated By: Bretta Bang, M.D.      1. Sinusitis       MDM  The patient has some nonspecific gastrointestinal complaints but has appeared to stay well-hydrated, we'll check an i-STAT chemistry to rule out hypokalemia or renal dysfunction, antibiotics for  sinusitis, Lomotil for persistent diarrhea, followup with family doctor anticipated.   CBC normal, no leukocytosis, normal renal function and electrolytes  PA and lateral views of the chest were obtained by digital radiography. I have personally interpreted these x-rays and find her to be no signs of pulmonary infiltrate, cardiomegaly, subdiaphragmatic free air, soft tissue abnormality, no obvious bony abnormalities or fractures.     Vida Roller, MD 10/09/12 [phone]

## 2012-10-22 ENCOUNTER — Emergency Department (HOSPITAL_COMMUNITY): Payer: Medicaid Other

## 2012-10-22 ENCOUNTER — Encounter (HOSPITAL_COMMUNITY): Payer: Self-pay | Admitting: Emergency Medicine

## 2012-10-22 ENCOUNTER — Emergency Department (HOSPITAL_COMMUNITY)
Admission: EM | Admit: 2012-10-22 | Discharge: 2012-10-22 | Disposition: A | Discharge status: Home / Self Care | Payer: Medicaid Other | Attending: Emergency Medicine | Admitting: Emergency Medicine

## 2012-10-22 DIAGNOSIS — S99929A Unspecified injury of unspecified foot, initial encounter: Secondary | ICD-10-CM | POA: Insufficient documentation

## 2012-10-22 DIAGNOSIS — S8990XA Unspecified injury of unspecified lower leg, initial encounter: Secondary | ICD-10-CM | POA: Insufficient documentation

## 2012-10-22 DIAGNOSIS — Y929 Unspecified place or not applicable: Secondary | ICD-10-CM | POA: Insufficient documentation

## 2012-10-22 DIAGNOSIS — S0993XA Unspecified injury of face, initial encounter: Secondary | ICD-10-CM | POA: Insufficient documentation

## 2012-10-22 DIAGNOSIS — Z79899 Other long term (current) drug therapy: Secondary | ICD-10-CM | POA: Insufficient documentation

## 2012-10-22 DIAGNOSIS — S46909A Unspecified injury of unspecified muscle, fascia and tendon at shoulder and upper arm level, unspecified arm, initial encounter: Secondary | ICD-10-CM | POA: Insufficient documentation

## 2012-10-22 DIAGNOSIS — W1789XA Other fall from one level to another, initial encounter: Secondary | ICD-10-CM | POA: Insufficient documentation

## 2012-10-22 DIAGNOSIS — S4980XA Other specified injuries of shoulder and upper arm, unspecified arm, initial encounter: Secondary | ICD-10-CM | POA: Insufficient documentation

## 2012-10-22 DIAGNOSIS — W1809XA Striking against other object with subsequent fall, initial encounter: Secondary | ICD-10-CM

## 2012-10-22 DIAGNOSIS — Z791 Long term (current) use of non-steroidal anti-inflammatories (NSAID): Secondary | ICD-10-CM | POA: Insufficient documentation

## 2012-10-22 DIAGNOSIS — Z8739 Personal history of other diseases of the musculoskeletal system and connective tissue: Secondary | ICD-10-CM | POA: Insufficient documentation

## 2012-10-22 DIAGNOSIS — Y9389 Activity, other specified: Secondary | ICD-10-CM | POA: Insufficient documentation

## 2012-10-22 DIAGNOSIS — F17200 Nicotine dependence, unspecified, uncomplicated: Secondary | ICD-10-CM | POA: Insufficient documentation

## 2012-10-22 DIAGNOSIS — S199XXA Unspecified injury of neck, initial encounter: Secondary | ICD-10-CM | POA: Insufficient documentation

## 2012-10-22 LAB — CBC WITH DIFFERENTIAL/PLATELET
Basophils Absolute: 0 10*3/uL (ref 0.0–0.1)
Basophils Relative: 1 % (ref 0–1)
Eosinophils Absolute: 0.2 10*3/uL (ref 0.0–0.7)
Eosinophils Relative: 5 % (ref 0–5)
HCT: 42.2 % (ref 39.0–52.0)
Hemoglobin: 15.1 g/dL (ref 13.0–17.0)
Lymphocytes Relative: 38 % (ref 12–46)
Lymphs Abs: 1.8 10*3/uL (ref 0.7–4.0)
MCH: 28.4 pg (ref 26.0–34.0)
MCHC: 35.8 g/dL (ref 30.0–36.0)
MCV: 79.5 fL (ref 78.0–100.0)
Monocytes Absolute: 0.7 10*3/uL (ref 0.1–1.0)
Monocytes Relative: 15 % — ABNORMAL HIGH (ref 3–12)
Neutro Abs: 2 10*3/uL (ref 1.7–7.7)
Neutrophils Relative %: 42 % — ABNORMAL LOW (ref 43–77)
Platelets: 286 10*3/uL (ref 150–400)
RBC: 5.31 MIL/uL (ref 4.22–5.81)
RDW: 14.1 % (ref 11.5–15.5)
WBC: 4.8 10*3/uL (ref 4.0–10.5)

## 2012-10-22 LAB — BASIC METABOLIC PANEL
BUN: 11 mg/dL (ref 6–23)
CO2: 24 mEq/L (ref 19–32)
Calcium: 9.9 mg/dL (ref 8.4–10.5)
Chloride: 101 mEq/L (ref 96–112)
Creatinine, Ser: 1.16 mg/dL (ref 0.50–1.35)
GFR calc Af Amer: 82 mL/min — ABNORMAL LOW (ref >90)
GFR calc non Af Amer: 71 mL/min — ABNORMAL LOW (ref >90)
Glucose, Bld: 89 mg/dL (ref 70–99)
Potassium: 4 mEq/L (ref 3.5–5.1)
Sodium: 136 mEq/L (ref 135–145)

## 2012-10-22 MED ORDER — HYDROMORPHONE HCL PF 1 MG/ML IJ SOLN
1.0000 mg | Freq: Once | INTRAMUSCULAR | Status: AC
  Administered 2012-10-22: 1 mg via INTRAVENOUS
  Filled 2012-10-22: qty 1

## 2012-10-22 MED ORDER — HYDROMORPHONE HCL PF 1 MG/ML IJ SOLN
0.5000 mg | Freq: Once | INTRAMUSCULAR | Status: AC
  Administered 2012-10-22: 0.5 mg via INTRAVENOUS
  Filled 2012-10-22: qty 1

## 2012-10-22 MED ORDER — HYDROCODONE-ACETAMINOPHEN 5-325 MG PO TABS: 2.0000 | ORAL_TABLET | Freq: Three times a day (TID) | ORAL | Status: DC | PRN

## 2012-10-22 NOTE — Progress Notes (Signed)
Orthopedic Tech Progress Note Patient Details:  Daniel Kemp 12-26-1960 [id number] Ace wrap applied to Right knee. Crutches fitted for patient height and comfort. Ortho Devices Type of Ortho Device: Ace wrap;Crutches Ortho Device/Splint Interventions: Application   Asia R Thompson 10/22/2012, 12:13 PM

## 2012-10-22 NOTE — ED Provider Notes (Addendum)
History     CSN: [id number]  Arrival date & time 10/22/12  4098   First MD Initiated Contact with Patient 10/22/12 [phone]      Chief Complaint  Patient presents with  . Fall    (Consider location/radiation/quality/duration/timing/severity/associated sxs/prior treatment) HPI The patient presents following a fall.  He notes that he was to have a truck, fell onto the ground.  He fell onto his right side.  Since the event he has not been ambulatory.  He denies head,, loss of consciousness. The patient has had pain in his right knee, left shoulder, neck and since the fall.  The patient has a history of cervical spine fusion. He denies concurrent chest pain, dyspnea, belly pain, nausea, vomiting. No relief with anything, and pain is worse with motion.  Past Medical History  Diagnosis Date  . Headache   . Arthritis   . Arm pain, right   . Back pain with radiation     Past Surgical History  Procedure Laterality Date  . Hand arthroplasty    . Anterior cervical decomp/discectomy fusion  07/28/2011    Procedure: ANTERIOR CERVICAL DECOMPRESSION/DISCECTOMY FUSION 2 LEVELS;  Surgeon: Jeffie Pollock [facility];  Location: [facility] OR;  Service: Orthopedics;  Laterality: N/A;  ACDF C6-7 and C7-T1    No family history on file.  History  Substance Use Topics  . Smoking status: Current Every Day Smoker -- 0.50 packs/day  . Smokeless tobacco: Not on file  . Alcohol Use: No      Review of Systems  All other systems reviewed and are negative.    Allergies  Fish-derived products and Peanut-containing drug products  Home Medications   Current Outpatient Rx  Name  Route  Sig  Dispense  Refill  . cyclobenzaprine (FLEXERIL) 5 MG tablet   Oral   Take 5 mg by mouth 3 (three) times daily as needed for muscle spasms.         . lansoprazole (PREVACID) 15 MG capsule   Oral   Take 15 mg by mouth daily as needed (for acid reflux).         . meloxicam (MOBIC) 15 MG tablet   Oral   Take 15 mg  by mouth daily.         Marland Kitchen omeprazole (PRILOSEC) 20 MG capsule   Oral   Take 20 mg by mouth daily.         . traMADol (ULTRAM) 50 MG tablet   Oral   Take 50 mg by mouth every 6 (six) hours as needed for pain.           BP 140/93  Pulse 73  Temp(Src) 98.8 F (37.1 C) (Oral)  Resp 24  SpO2 100%  Physical Exam  Nursing note and vitals reviewed. Constitutional: He is oriented to person, place, and time. He appears well-developed. No distress. Cervical collar and backboard in place.  HENT:  Head: Normocephalic and atraumatic.  Eyes: Conjunctivae and EOM are normal.  Cardiovascular: Normal rate and regular rhythm.   Pulmonary/Chest: Effort normal. No stridor. No respiratory distress.  Abdominal: He exhibits no distension.  Musculoskeletal: He exhibits no edema.       Right shoulder: Normal.       Left shoulder: He exhibits tenderness, bony tenderness and pain. He exhibits no swelling, no effusion, no crepitus, no deformity, no laceration, no spasm, normal pulse and normal strength.       Right hip: Normal.       Left hip: Normal.  Right knee: He exhibits decreased range of motion, swelling and bony tenderness. He exhibits no effusion, no ecchymosis, no deformity, no laceration, no erythema, normal alignment, no LCL laxity, normal meniscus and no MCL laxity. Tenderness found. Medial joint line and lateral joint line tenderness noted. No MCL, no LCL and no patellar tendon tenderness noted.       Thoracic back: He exhibits tenderness, bony tenderness and pain. He exhibits no swelling, no edema, no deformity, no laceration and no spasm.  Neurological: He is alert and oriented to person, place, and time.  Skin: Skin is warm and dry.  Psychiatric: He has a normal mood and affect.    ED Course  ORTHOPEDIC INJURY TREATMENT Date/Time: 10/22/2012 11:51 AM Performed by: Gerhard Munch Authorized by: Gerhard Munch Consent: Verbal consent obtained. The procedure was performed  in an emergent situation. Risks and benefits: risks, benefits and alternatives were discussed Consent given by: patient Patient identity confirmed: verbally with patient Time out: Immediately prior to procedure a "time out" was called to verify the correct patient, procedure, equipment, support staff and site/side marked as required. Injury location: knee Location details: right knee Injury type: soft tissue Pre-procedure neurovascular assessment: neurovascularly intact Pre-procedure distal perfusion: normal Pre-procedure neurological function: normal Pre-procedure range of motion: reduced Local anesthesia used: no Patient sedated: no Immobilization: crutches (Ace wrap) Splint type: short leg Post-procedure neurovascular assessment: post-procedure neurovascularly intact Post-procedure distal perfusion: normal Post-procedure neurological function: normal Post-procedure range of motion: unchanged Patient tolerance: Patient tolerated the procedure well with no immediate complications.   (including critical care time)  Labs Reviewed  CBC WITH DIFFERENTIAL - Abnormal; Notable for the following:    Neutrophils Relative 42 (*)    Monocytes Relative 15 (*)    All other components within normal limits  BASIC METABOLIC PANEL   No results found.   No diagnosis found.  Cardiac 70 sinus rhythm normal Pulse ox 100% room air normal   Immediately after the initial evaluation we remove the patient from the backboard using logroll precautions.  Update: On repeat exam the patient has knee pain persistently.  We reviewed all of the results.  The patient's wife is present.  Given the persistency of his knee pain, decreased range of motion, 180/150 with diffuse tenderness, CT ordered  MDM  The patient presents after a mechanical fall with no loss of consciousness, but with ongoing pain in multiple areas.  The patient is neurologically intact, uncomfortable, but in no distress.  The patient's  x-ray, CT were all reassuring.  The patient was discharged in stable condition with analgesics  Gerhard Munch, MD 10/22/12 1147  Gerhard Munch, MD 10/22/12 1152

## 2012-10-22 NOTE — ED Notes (Addendum)
Pt is having numbness in R hand and R leg. Pt has 3+ peripheral pulses and perfusion to all extremities.

## 2012-10-22 NOTE — ED Notes (Signed)
Pt right lower extremity is turned proximal to body. Pt is unable to straighten leg. No shortening noted.

## 2012-10-22 NOTE — ED Notes (Signed)
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 Pt tried to catch himself with right hand and also landed on his right knee. Pt immediately felt a sharp pain going to his neck. Pt has a hx of back surgeries. Pt denies hitting head. No loss of consciousness.

## 2012-10-22 NOTE — ED Notes (Signed)
MD at bedside. 

## 2017-11-23 ENCOUNTER — Ambulatory Visit (INDEPENDENT_AMBULATORY_CARE_PROVIDER_SITE_OTHER): Payer: Medicare Other | Admitting: Physician Assistant

## 2017-11-23 ENCOUNTER — Encounter (INDEPENDENT_AMBULATORY_CARE_PROVIDER_SITE_OTHER): Payer: Self-pay | Admitting: Physician Assistant

## 2017-11-23 ENCOUNTER — Other Ambulatory Visit: Payer: Self-pay

## 2017-11-23 VITALS — BP 136/79 | HR 89 | Temp 98.4°F | Ht 76.0 in | Wt 239.0 lb

## 2017-11-23 DIAGNOSIS — Z114 Encounter for screening for human immunodeficiency virus [HIV]: Secondary | ICD-10-CM

## 2017-11-23 DIAGNOSIS — Z1159 Encounter for screening for other viral diseases: Secondary | ICD-10-CM | POA: Diagnosis not present

## 2017-11-23 DIAGNOSIS — M542 Cervicalgia: Secondary | ICD-10-CM | POA: Diagnosis not present

## 2017-11-23 DIAGNOSIS — I10 Essential (primary) hypertension: Secondary | ICD-10-CM | POA: Diagnosis not present

## 2017-11-23 DIAGNOSIS — Z760 Encounter for issue of repeat prescription: Secondary | ICD-10-CM

## 2017-11-23 MED ORDER — LISINOPRIL-HYDROCHLOROTHIAZIDE 10-12.5 MG PO TABS: 2.0000 | ORAL_TABLET | Freq: Every day | ORAL | 3 refills | Status: DC

## 2017-11-23 MED ORDER — ATORVASTATIN CALCIUM 40 MG PO TABS: 40.0000 mg | ORAL_TABLET | Freq: Every day | ORAL | 3 refills | Status: DC

## 2017-11-23 MED ORDER — CYCLOBENZAPRINE HCL 5 MG PO TABS: 5.0000 mg | ORAL_TABLET | Freq: Every day | ORAL | 2 refills | Status: DC

## 2017-11-23 MED ORDER — MELOXICAM 15 MG PO TABS
15.0000 mg | ORAL_TABLET | Freq: Every day | ORAL | 2 refills | Status: DC | PRN
Start: 2017-11-23 — End: 2018-02-02

## 2017-11-23 MED ORDER — CETIRIZINE HCL 10 MG PO TABS: 10.0000 mg | ORAL_TABLET | Freq: Every day | ORAL | 5 refills | Status: DC | PRN

## 2017-11-23 NOTE — Patient Instructions (Signed)
Managing Your Hypertension Hypertension is commonly called high blood pressure. This is when the force of your blood pressing against the walls of your arteries is too strong. Arteries are blood vessels that carry blood from your heart throughout your body. Hypertension forces the heart to work harder to pump blood, and may cause the arteries to become narrow or stiff. Having untreated or uncontrolled hypertension can cause heart attack, stroke, kidney disease, and other problems. What are blood pressure readings? A blood pressure reading consists of a higher number over a lower number. Ideally, your blood pressure should be below 120/80. The first ("top") number is called the systolic pressure. It is a measure of the pressure in your arteries as your heart beats. The second ("bottom") number is called the diastolic pressure. It is a measure of the pressure in your arteries as the heart relaxes. What does my blood pressure reading mean? Blood pressure is classified into four stages. Based on your blood pressure reading, your health care provider may use the following stages to determine what type of treatment you need, if any. Systolic pressure and diastolic pressure are measured in a unit called mm Hg. Normal  Systolic pressure: below 120.  Diastolic pressure: below 80. Elevated  Systolic pressure: 120-129.  Diastolic pressure: below 80. Hypertension stage 1  Systolic pressure: 130-139.  Diastolic pressure: 80-89. Hypertension stage 2  Systolic pressure: 140 or above.  Diastolic pressure: 90 or above. What health risks are associated with hypertension? Managing your hypertension is an important responsibility. Uncontrolled hypertension can lead to:  A heart attack.  A stroke.  A weakened blood vessel (aneurysm).  Heart failure.  Kidney damage.  Eye damage.  Metabolic syndrome.  Memory and concentration problems.  What changes can I make to manage my  hypertension? Hypertension can be managed by making lifestyle changes and possibly by taking medicines. Your health care provider will help you make a plan to bring your blood pressure within a normal range. Eating and drinking  Eat a diet that is high in fiber and potassium, and low in salt (sodium), added sugar, and fat. An example eating plan is called the DASH (Dietary Approaches to Stop Hypertension) diet. To eat this way: ? Eat plenty of fresh fruits and vegetables. Try to fill half of your plate at each meal with fruits and vegetables. ? Eat whole grains, such as whole wheat pasta, brown rice, or whole grain bread. Fill about one quarter of your plate with whole grains. ? Eat low-fat diary products. ? Avoid fatty cuts of meat, processed or cured meats, and poultry with skin. Fill about one quarter of your plate with lean proteins such as fish, chicken without skin, beans, eggs, and tofu. ? Avoid premade and processed foods. These tend to be higher in sodium, added sugar, and fat.  Reduce your daily sodium intake. Most people with hypertension should eat less than 1,500 mg of sodium a day.  Limit alcohol intake to no more than 1 drink a day for nonpregnant women and 2 drinks a day for men. One drink equals 12 oz of beer, 5 oz of wine, or 1 oz of hard liquor. Lifestyle  Work with your health care provider to maintain a healthy body weight, or to lose weight. Ask what an ideal weight is for you.  Get at least 30 minutes of exercise that causes your heart to beat faster (aerobic exercise) most days of the week. Activities may include walking, swimming, or biking.  Include exercise   to strengthen your muscles (resistance exercise), such as weight lifting, as part of your weekly exercise routine. Try to do these types of exercises for 30 minutes at least 3 days a week.  Do not use any products that contain nicotine or tobacco, such as cigarettes and e-cigarettes. If you need help quitting, ask  your health care provider.  Control any long-term (chronic) conditions you have, such as high cholesterol or diabetes. Monitoring  Monitor your blood pressure at home as told by your health care provider. Your personal target blood pressure may vary depending on your medical conditions, your age, and other factors.  Have your blood pressure checked regularly, as often as told by your health care provider. Working with your health care provider  Review all the medicines you take with your health care provider because there may be side effects or interactions.  Talk with your health care provider about your diet, exercise habits, and other lifestyle factors that may be contributing to hypertension.  Visit your health care provider regularly. Your health care provider can help you create and adjust your plan for managing hypertension. Will I need medicine to control my blood pressure? Your health care provider may prescribe medicine if lifestyle changes are not enough to get your blood pressure under control, and if:  Your systolic blood pressure is 130 or higher.  Your diastolic blood pressure is 80 or higher.  Take medicines only as told by your health care provider. Follow the directions carefully. Blood pressure medicines must be taken as prescribed. The medicine does not work as well when you skip doses. Skipping doses also puts you at risk for problems. Contact a health care provider if:  You think you are having a reaction to medicines you have taken.  You have repeated (recurrent) headaches.  You feel dizzy.  You have swelling in your ankles.  You have trouble with your vision. Get help right away if:  You develop a severe headache or confusion.  You have unusual weakness or numbness, or you feel faint.  You have severe pain in your chest or abdomen.  You vomit repeatedly.  You have trouble breathing. Summary  Hypertension is when the force of blood pumping through  your arteries is too strong. If this condition is not controlled, it may put you at risk for serious complications.  Your personal target blood pressure may vary depending on your medical conditions, your age, and other factors. For most people, a normal blood pressure is less than 120/80.  Hypertension is managed by lifestyle changes, medicines, or both. Lifestyle changes include weight loss, eating a healthy, low-sodium diet, exercising more, and limiting alcohol. This information is not intended to replace advice given to you by your health care provider. Make sure you discuss any questions you have with your health care provider. Document Released: 04/11/2012 Document Revised: 06/15/2016 Document Reviewed: 06/15/2016 [employer] Interactive Patient Education  2018 [employer]  

## 2017-11-23 NOTE — Progress Notes (Signed)
Subjective:  Patient ID: Daniel Patersonimothy E Sistrunk, male    DOB: May 23, 1961  Age: 57 y.o. MRN: [id number]  CC: HTN  HPI Daniel Kemp is a 57 y.o. male with a medical history of HTN, back pain with radiculopathy, anterior cervical decompression/discectomy and fusion 2 levels. Currently going to [facility] for his cervicalgia. Says orthopedics can not do anything else for his neck pain.     Also here for management of his HTN which has been relatively controlled with Prinzide. Does not endorse cardiovascular or pulmonary complaints. Would like a refill of all his medications. No other complaints or symptoms.           Outpatient Medications Prior to Visit  Medication Sig Dispense Refill  . atorvastatin (LIPITOR) 40 MG tablet Take 40 mg by mouth at bedtime.    . cetirizine (ZYRTEC) 10 MG tablet Take 10 mg by mouth daily as needed.  5  . cyclobenzaprine (FLEXERIL) 5 MG tablet Take 5 mg by mouth 3 (three) times daily as needed for muscle spasms.    Marland Kitchen. HYDROcodone-acetaminophen (NORCO/VICODIN) 5-325 MG per tablet Take 2 tablets by mouth every 8 (eight) hours as needed for pain. 10 tablet 0  . lisinopril-hydrochlorothiazide (PRINZIDE,ZESTORETIC) 10-12.5 MG tablet Take 12.5 tablets by mouth daily.    . meloxicam (MOBIC) 15 MG tablet Take 15 mg by mouth daily.    Marland Kitchen. NEXIUM 40 MG capsule Take 40 mg by mouth daily.    . traMADol (ULTRAM) 50 MG tablet Take 50 mg by mouth daily as needed.    . lansoprazole (PREVACID) 15 MG capsule Take 15 mg by mouth daily as needed (for acid reflux).    Marland Kitchen. omeprazole (PRILOSEC) 20 MG capsule Take 20 mg by mouth daily.     No facility-administered medications prior to visit.      ROS Review of Systems  Constitutional: Negative for chills, fever and malaise/fatigue.  Eyes: Negative for blurred vision.  Respiratory: Negative for shortness of breath.   Cardiovascular: Negative for chest pain and palpitations.  Gastrointestinal: Negative for abdominal pain and  nausea.  Genitourinary: Negative for dysuria and hematuria.  Musculoskeletal: Negative for joint pain and myalgias.  Skin: Negative for rash.  Neurological: Negative for tingling and headaches.  Psychiatric/Behavioral: Negative for depression. The patient is not nervous/anxious.     Objective:  BP 136/79 (BP Location: Left Arm, Patient Position: Sitting, Cuff Size: Large)   Pulse 89   Temp 98.4 F (36.9 C) (Oral)   Ht 6\' 4"  (1.93 m)   Wt 239 lb (108.4 kg)   SpO2 97%   BMI 29.09 kg/m   BP/Weight 11/23/2017 10/22/2012 10/09/2012  Systolic BP 136 139 116  Diastolic BP 79 95 84  Wt. (Lbs) 239 - 215  BMI 29.09 - 26.18      Physical Exam  Constitutional: He is oriented to person, place, and time.  Well developed, well nourished, NAD, polite  HENT:  Head: Normocephalic and atraumatic.  Eyes: No scleral icterus.  Neck: Normal range of motion. Neck supple. No thyromegaly present.  Cardiovascular: Normal rate, regular rhythm and normal heart sounds. Exam reveals no gallop and no friction rub.  No murmur heard. No LE edema bilaterally  Pulmonary/Chest: Effort normal and breath sounds normal.  Abdominal: Soft. Bowel sounds are normal. There is no tenderness.  Musculoskeletal: He exhibits no edema.  Neurological: He is alert and oriented to person, place, and time.  Skin: Skin is warm and dry. No rash noted. No  erythema. No pallor.  Psychiatric: He has a normal mood and affect. His behavior is normal. Thought content normal.  Vitals reviewed.    Assessment & Plan:    1. Hypertension, unspecified type - CBC with Differential - Comprehensive metabolic panel - Lipid panel - Increase lisinopril-hydrochlorothiazide (PRINZIDE,ZESTORETIC) 10-12.5 MG tablet; Take 2 tablets by mouth daily.  Dispense: 180 tablet; Refill: 3  2. Cervicalgia - Begin meloxicam (MOBIC) 15 MG tablet; Take 1 tablet (15 mg total) by mouth daily as needed for pain.  Dispense: 30 tablet; Refill: 2 - Begin  cyclobenzaprine (FLEXERIL) 5 MG tablet; Take 1 tablet (5 mg total) by mouth at bedtime.  Dispense: 30 tablet; Refill: 2  3. Screening for HIV (human immunodeficiency virus) - HIV antibody  4. Need for hepatitis C screening test - Hepatitis c antibody (reflex)  5. Medication refill - cetirizine (ZYRTEC) 10 MG tablet; Take 1 tablet (10 mg total) by mouth daily as needed.  Dispense: 30 tablet; Refill: 5 - atorvastatin (LIPITOR) 40 MG tablet; Take 1 tablet (40 mg total) by mouth at bedtime.  Dispense: 90 tablet; Refill: 3   Meds ordered this encounter  Medications  . lisinopril-hydrochlorothiazide (PRINZIDE,ZESTORETIC) 10-12.5 MG tablet    Sig: Take 2 tablets by mouth daily.    Dispense:  180 tablet    Refill:  3    Order Specific Question:   Supervising Provider    Answer:   Quentin Angst [id number]  . meloxicam (MOBIC) 15 MG tablet    Sig: Take 1 tablet (15 mg total) by mouth daily as needed for pain.    Dispense:  30 tablet    Refill:  2    Order Specific Question:   Supervising Provider    Answer:   Quentin Angst [id number]  . cyclobenzaprine (FLEXERIL) 5 MG tablet    Sig: Take 1 tablet (5 mg total) by mouth at bedtime.    Dispense:  30 tablet    Refill:  2    Order Specific Question:   Supervising Provider    Answer:   Quentin Angst [id number]  . cetirizine (ZYRTEC) 10 MG tablet    Sig: Take 1 tablet (10 mg total) by mouth daily as needed.    Dispense:  30 tablet    Refill:  5    Order Specific Question:   Supervising Provider    Answer:   Quentin Angst [id number]  . atorvastatin (LIPITOR) 40 MG tablet    Sig: Take 1 tablet (40 mg total) by mouth at bedtime.    Dispense:  90 tablet    Refill:  3    Order Specific Question:   Supervising Provider    Answer:   Quentin Angst [id number]    Follow-up: Return in about 1 month (around 12/21/2017) for HTN.   Loletta Specter PA

## 2017-11-24 ENCOUNTER — Telehealth (INDEPENDENT_AMBULATORY_CARE_PROVIDER_SITE_OTHER): Payer: Self-pay

## 2017-11-24 ENCOUNTER — Other Ambulatory Visit (INDEPENDENT_AMBULATORY_CARE_PROVIDER_SITE_OTHER): Payer: Self-pay | Admitting: Physician Assistant

## 2017-11-24 LAB — CBC WITH DIFFERENTIAL/PLATELET
Basophils Absolute: 0 10*3/uL (ref 0.0–0.2)
Basos: 0 %
EOS (ABSOLUTE): 0 10*3/uL (ref 0.0–0.4)
Eos: 0 %
Hematocrit: 40.4 % (ref 37.5–51.0)
Hemoglobin: 13.8 g/dL (ref 13.0–17.7)
Immature Grans (Abs): 0 10*3/uL (ref 0.0–0.1)
Immature Granulocytes: 0 %
Lymphocytes Absolute: 1 10*3/uL (ref 0.7–3.1)
Lymphs: 15 %
MCH: 28.5 pg (ref 26.6–33.0)
MCHC: 34.2 g/dL (ref 31.5–35.7)
MCV: 84 fL (ref 79–97)
Monocytes Absolute: 0.8 10*3/uL (ref 0.1–0.9)
Monocytes: 11 %
Neutrophils Absolute: 4.8 10*3/uL (ref 1.4–7.0)
Neutrophils: 74 %
Platelets: 291 10*3/uL (ref 150–379)
RBC: 4.84 x10E6/uL (ref 4.14–5.80)
RDW: 15.1 % (ref 12.3–15.4)
WBC: 6.6 10*3/uL (ref 3.4–10.8)

## 2017-11-24 LAB — COMPREHENSIVE METABOLIC PANEL
ALT: 18 IU/L (ref 0–44)
AST: 17 IU/L (ref 0–40)
Albumin/Globulin Ratio: 1.4 (ref 1.2–2.2)
Albumin: 4.3 g/dL (ref 3.5–5.5)
Alkaline Phosphatase: 84 IU/L (ref 39–117)
BUN/Creatinine Ratio: 10 (ref 9–20)
BUN: 12 mg/dL (ref 6–24)
Bilirubin Total: 0.5 mg/dL (ref 0.0–1.2)
CO2: 23 mmol/L (ref 20–29)
Calcium: 9.5 mg/dL (ref 8.7–10.2)
Chloride: 101 mmol/L (ref 96–106)
Creatinine, Ser: 1.25 mg/dL (ref 0.76–1.27)
GFR calc Af Amer: 73 mL/min/{1.73_m2} (ref >59)
GFR calc non Af Amer: 64 mL/min/{1.73_m2} (ref >59)
Globulin, Total: 3.1 g/dL (ref 1.5–4.5)
Glucose: 86 mg/dL (ref 65–99)
Potassium: 3.7 mmol/L (ref 3.5–5.2)
Sodium: 141 mmol/L (ref 134–144)
Total Protein: 7.4 g/dL (ref 6.0–8.5)

## 2017-11-24 LAB — LIPID PANEL
Chol/HDL Ratio: 4 ratio (ref 0.0–5.0)
Cholesterol, Total: 176 mg/dL (ref 100–199)
HDL: 44 mg/dL (ref >39)
LDL Calculated: 112 mg/dL — ABNORMAL HIGH (ref 0–99)
Triglycerides: 99 mg/dL (ref 0–149)
VLDL Cholesterol Cal: 20 mg/dL (ref 5–40)

## 2017-11-24 LAB — HIV ANTIBODY (ROUTINE TESTING W REFLEX): HIV Screen 4th Generation wRfx: NONREACTIVE

## 2017-11-24 LAB — HCV COMMENT:

## 2017-11-24 LAB — HEPATITIS C ANTIBODY (REFLEX): HCV Ab: <0.1 s/co ratio (ref 0.0–0.9)

## 2017-11-24 NOTE — Telephone Encounter (Signed)
-----   Message from Loletta Specteroger David Gomez, PA-C sent at 11/24/2017  8:37 AM EDT ----- Labs normal except for mildly elevated LDL. I have already refilled his atorvastatin. Take as directed and retest cholesterol in six months.

## 2017-11-24 NOTE — Telephone Encounter (Signed)
Left patient a voicemail informing of normal labs, except for mildly elevated LDL. Atorvastatin already refilled, take as directed and retest cholesterol in 6 months. Maryjean Mornempestt S Tatum Corl, CMA

## 2018-01-02 ENCOUNTER — Other Ambulatory Visit (INDEPENDENT_AMBULATORY_CARE_PROVIDER_SITE_OTHER): Payer: Self-pay | Admitting: Physician Assistant

## 2018-01-02 ENCOUNTER — Encounter (INDEPENDENT_AMBULATORY_CARE_PROVIDER_SITE_OTHER): Payer: Self-pay | Admitting: Physician Assistant

## 2018-01-02 ENCOUNTER — Ambulatory Visit (INDEPENDENT_AMBULATORY_CARE_PROVIDER_SITE_OTHER): Payer: Medicare Other | Admitting: Physician Assistant

## 2018-01-02 ENCOUNTER — Other Ambulatory Visit: Payer: Self-pay

## 2018-01-02 VITALS — BP 124/80 | HR 77 | Temp 97.7°F | Ht 76.0 in | Wt 238.4 lb

## 2018-01-02 DIAGNOSIS — I10 Essential (primary) hypertension: Secondary | ICD-10-CM | POA: Diagnosis not present

## 2018-01-02 DIAGNOSIS — R200 Anesthesia of skin: Secondary | ICD-10-CM | POA: Diagnosis not present

## 2018-01-02 MED ORDER — GABAPENTIN 100 MG PO CAPS: 100.0000 mg | ORAL_CAPSULE | Freq: Three times a day (TID) | ORAL | 1 refills | Status: DC

## 2018-01-02 MED ORDER — AMITRIPTYLINE HCL 10 MG PO TABS: 10.0000 mg | ORAL_TABLET | Freq: Every day | ORAL | 1 refills | Status: DC

## 2018-01-02 NOTE — Progress Notes (Addendum)
Subjective:  Patient ID: Daniel Kemp, male    DOB: [birth year]  Age: 57 y.o. MRN: [id number]  CC: HTN, peripheral neuropathy  HPI  Daniel Kemp is a 57 y.o. male with a medical history of HTN, back pain with radiculopathy, anterior cervical decompression/discectomy and fusion 2 levels. Currently going to [facility] for his cervicalgia. States he had [facility] perform nerve testing and was told he had worsening nerve function of the UE bilaterally. He is worried and thinks he now has impaired sensation of the LLE. LLE feels "numb" with no associated LBP, weakness, paralysis, impaired gait, saddle paresthesia, urinary incontinence, or fecal incontinence.     Also here for management of his HTN which is now well controlled after the increase of his Prinzide dose. Does not endorse CP, palpitations, SOB, HA, abdominal pain, f/c/n/v, or GI/GU sxs.      Outpatient Medications Prior to Visit  Medication Sig Dispense Refill  . atorvastatin (LIPITOR) 40 MG tablet Take 1 tablet (40 mg total) by mouth at bedtime. 90 tablet 3  . cetirizine (ZYRTEC) 10 MG tablet Take 1 tablet (10 mg total) by mouth daily as needed. 30 tablet 5  . cyclobenzaprine (FLEXERIL) 5 MG tablet Take 1 tablet (5 mg total) by mouth at bedtime. 30 tablet 2  . HYDROcodone-acetaminophen (NORCO/VICODIN) 5-325 MG per tablet Take 2 tablets by mouth every 8 (eight) hours as needed for pain. 10 tablet 0  . lisinopril-hydrochlorothiazide (PRINZIDE,ZESTORETIC) 10-12.5 MG tablet Take 2 tablets by mouth daily. 180 tablet 3  . meloxicam (MOBIC) 15 MG tablet Take 1 tablet (15 mg total) by mouth daily as needed for pain. 30 tablet 2  . NEXIUM 40 MG capsule Take 40 mg by mouth daily.    . fluticasone (FLONASE) 50 MCG/ACT nasal spray     . traMADol (ULTRAM) 50 MG tablet Take 50 mg by mouth daily as needed.     No facility-administered medications prior to visit.      ROS Review of Systems  Constitutional: Negative for chills, fever  and malaise/fatigue.  Eyes: Negative for blurred vision.  Respiratory: Negative for shortness of breath.   Cardiovascular: Negative for chest pain and palpitations.  Gastrointestinal: Negative for abdominal pain and nausea.  Genitourinary: Negative for dysuria and hematuria.  Musculoskeletal: Negative for joint pain and myalgias.  Skin: Negative for rash.  Neurological: Positive for sensory change. Negative for tingling and headaches.  Psychiatric/Behavioral: Negative for depression. The patient is not nervous/anxious.     Objective:  BP 124/80 (BP Location: Left Arm, Patient Position: Sitting, Cuff Size: Large)   Pulse 77   Temp 97.7 F (36.5 C) (Oral)   Ht 6\' 4"  (1.93 m)   Wt 238 lb 6.4 oz (108.1 kg)   SpO2 97%   BMI 29.02 kg/m   BP/Weight 01/02/2018 11/23/2017 10/22/2012  Systolic BP 124 136 139  Diastolic BP 80 79 95  Wt. (Lbs) 238.4 239 -  BMI 29.02 29.09 -      Physical Exam  Constitutional: He is oriented to person, place, and time.  Well developed, well nourished, NAD, polite  HENT:  Head: Normocephalic and atraumatic.  Eyes: No scleral icterus.  Neck: Normal range of motion. Neck supple. No thyromegaly present.  Cardiovascular: Normal rate, regular rhythm and normal heart sounds. Exam reveals no gallop and no friction rub.  No murmur heard. No carotid bruit bilaterally  Pulmonary/Chest: Effort normal and breath sounds normal.  Abdominal: Soft. Bowel sounds are normal. There is  no tenderness.  Musculoskeletal: He exhibits no edema.  Neurological: He is alert and oriented to person, place, and time.  Left patellar DTR 1+, right patellar DTR 3+. Decreased light touch sensation of the LLE in the region of the anterolateral gastrocnemius. Strength 5/5 throughout.  Skin: Skin is warm and dry. No rash noted. No erythema. No pallor.  Psychiatric: He has a normal mood and affect. His behavior is normal. Thought content normal.  Vitals reviewed.    Assessment & Plan:     1. Lower extremity numbness - Pt poor historian but I suspect there may be a relation with his previous chronic LBP with radiculopathy. - Begin gabapentin (NEURONTIN) 100 MG capsule; Take 1 capsule (100 mg total) by mouth 3 (three) times daily.  Dispense: 90 capsule; Refill: 1 - Ambulatory referral to Neurology  2. Hypertension, unspecified type - Well controlled with Prinzide 20 - 25 mg dose. Continue taking as directed.    Meds ordered this encounter  Medications  . gabapentin (NEURONTIN) 100 MG capsule    Sig: Take 1 capsule (100 mg total) by mouth 3 (three) times daily.    Dispense:  90 capsule    Refill:  1    Order Specific Question:   Supervising Provider    Answer:   Hoy RegisterNEWLIN, ENOBONG [[id number]]    Follow-up: Return in about 1 month (around 01/30/2018) for LE numbness.   Loletta Specteroger David Syesha Thaw PA

## 2018-01-02 NOTE — Patient Instructions (Signed)
Gabapentin capsules or tablets What is this medicine? GABAPENTIN (GA ba pen tin) is used to control partial seizures in adults with epilepsy. It is also used to treat certain types of nerve pain. This medicine may be used for other purposes; ask your health care provider or pharmacist if you have questions. COMMON BRAND NAME(S): Active-PAC with Gabapentin, Gabarone, Neurontin What should I tell my health care provider before I take this medicine? They need to know if you have any of these conditions: -kidney disease -suicidal thoughts, plans, or attempt; a previous suicide attempt by you or a family member -an unusual or allergic reaction to gabapentin, other medicines, foods, dyes, or preservatives -pregnant or trying to get pregnant -breast-feeding How should I use this medicine? Take this medicine by mouth with a glass of water. Follow the directions on the prescription label. You can take it with or without food. If it upsets your stomach, take it with food.Take your medicine at regular intervals. Do not take it more often than directed. Do not stop taking except on your doctor's advice. If you are directed to break the 600 or 800 mg tablets in half as part of your dose, the extra half tablet should be used for the next dose. If you have not used the extra half tablet within 28 days, it should be thrown away. A special MedGuide will be given to you by the pharmacist with each prescription and refill. Be sure to read this information carefully each time. Talk to your pediatrician regarding the use of this medicine in children. Special care may be needed. Overdosage: If you think you have taken too much of this medicine contact a poison control center or emergency room at once. NOTE: This medicine is only for you. Do not share this medicine with others. What if I miss a dose? If you miss a dose, take it as soon as you can. If it is almost time for your next dose, take only that dose. Do not  take double or extra doses. What may interact with this medicine? Do not take this medicine with any of the following medications: -other gabapentin products This medicine may also interact with the following medications: -alcohol -antacids -antihistamines for allergy, cough and cold -certain medicines for anxiety or sleep -certain medicines for depression or psychotic disturbances -homatropine; hydrocodone -naproxen -narcotic medicines (opiates) for pain -phenothiazines like chlorpromazine, mesoridazine, prochlorperazine, thioridazine This list may not describe all possible interactions. Give your health care provider a list of all the medicines, herbs, non-prescription drugs, or dietary supplements you use. Also tell them if you smoke, drink alcohol, or use illegal drugs. Some items may interact with your medicine. What should I watch for while using this medicine? Visit your doctor or health care professional for regular checks on your progress. You may want to keep a record at home of how you feel your condition is responding to treatment. You may want to share this information with your doctor or health care professional at each visit. You should contact your doctor or health care professional if your seizures get worse or if you have any new types of seizures. Do not stop taking this medicine or any of your seizure medicines unless instructed by your doctor or health care professional. Stopping your medicine suddenly can increase your seizures or their severity. Wear a medical identification bracelet or chain if you are taking this medicine for seizures, and carry a card that lists all your medications. You may get drowsy, dizzy,   or have blurred vision. Do not drive, use machinery, or do anything that needs mental alertness until you know how this medicine affects you. To reduce dizzy or fainting spells, do not sit or stand up quickly, especially if you are an older patient. Alcohol can  increase drowsiness and dizziness. Avoid alcoholic drinks. Your mouth may get dry. Chewing sugarless gum or sucking hard candy, and drinking plenty of water will help. The use of this medicine may increase the chance of suicidal thoughts or actions. Pay special attention to how you are responding while on this medicine. Any worsening of mood, or thoughts of suicide or dying should be reported to your health care professional right away. Women who become pregnant while using this medicine may enroll in the [place] Antiepileptic Drug Pregnancy Registry by calling 1-888-233-2334. This registry collects information about the safety of antiepileptic drug use during pregnancy. What side effects may I notice from receiving this medicine? Side effects that you should report to your doctor or health care professional as soon as possible: -allergic reactions like skin rash, itching or hives, swelling of the face, lips, or tongue -worsening of mood, thoughts or actions of suicide or dying Side effects that usually do not require medical attention (report to your doctor or health care professional if they continue or are bothersome): -constipation -difficulty walking or controlling muscle movements -dizziness -nausea -slurred speech -tiredness -tremors -weight gain This list may not describe all possible side effects. Call your doctor for medical advice about side effects. You may report side effects to FDA at 1-800-FDA-1088. Where should I keep my medicine? Keep out of reach of children. This medicine may cause accidental overdose and death if it taken by other adults, children, or pets. Mix any unused medicine with a substance like cat litter or coffee grounds. Then throw the medicine away in a sealed container like a sealed bag or a coffee can with a lid. Do not use the medicine after the expiration date. Store at room temperature between 15 and 30 degrees C (59 and 86 degrees F). NOTE: This  sheet is a summary. It may not cover all possible information. If you have questions about this medicine, talk to your doctor, pharmacist, or health care provider.  2018 Elsevier/Gold Standard (2013-09-13 15:26:50)  

## 2018-01-03 NOTE — Telephone Encounter (Signed)
Patient requesting 90 day supply. FWD to PCP. Maryjean Mornempestt S Roberts, CMA

## 2018-01-10 ENCOUNTER — Telehealth (INDEPENDENT_AMBULATORY_CARE_PROVIDER_SITE_OTHER): Payer: Self-pay | Admitting: Physician Assistant

## 2018-01-10 NOTE — Telephone Encounter (Signed)
Pt called to inform PCP he wants all [facility] on.   *For instructions on how to call them in pt provided  - [phone] p

## 2018-02-02 ENCOUNTER — Ambulatory Visit (INDEPENDENT_AMBULATORY_CARE_PROVIDER_SITE_OTHER): Payer: Medicare Other | Admitting: Physician Assistant

## 2018-02-02 ENCOUNTER — Ambulatory Visit (HOSPITAL_COMMUNITY)
Admission: RE | Admit: 2018-02-02 | Discharge: 2018-02-02 | Disposition: A | Discharge status: Home / Self Care | Payer: Medicare Other | Source: Ambulatory Visit | Attending: Physician Assistant | Admitting: Physician Assistant

## 2018-02-02 ENCOUNTER — Ambulatory Visit (HOSPITAL_BASED_OUTPATIENT_CLINIC_OR_DEPARTMENT_OTHER): Payer: Medicare Other

## 2018-02-02 ENCOUNTER — Encounter (INDEPENDENT_AMBULATORY_CARE_PROVIDER_SITE_OTHER): Payer: Self-pay | Admitting: Physician Assistant

## 2018-02-02 ENCOUNTER — Other Ambulatory Visit (INDEPENDENT_AMBULATORY_CARE_PROVIDER_SITE_OTHER): Payer: Self-pay | Admitting: Physician Assistant

## 2018-02-02 ENCOUNTER — Other Ambulatory Visit: Payer: Self-pay

## 2018-02-02 VITALS — BP 124/85 | HR 77 | Temp 97.7°F | Ht 76.0 in | Wt 236.4 lb

## 2018-02-02 DIAGNOSIS — G8929 Other chronic pain: Secondary | ICD-10-CM | POA: Diagnosis present

## 2018-02-02 DIAGNOSIS — M25552 Pain in left hip: Secondary | ICD-10-CM | POA: Diagnosis not present

## 2018-02-02 DIAGNOSIS — R200 Anesthesia of skin: Secondary | ICD-10-CM

## 2018-02-02 MED ORDER — PREGABALIN 50 MG PO CAPS: 50.0000 mg | ORAL_CAPSULE | Freq: Two times a day (BID) | ORAL | 2 refills | Status: DC

## 2018-02-02 MED ORDER — MELOXICAM 15 MG PO TABS: 15.0000 mg | ORAL_TABLET | Freq: Every day | ORAL | 0 refills | Status: DC

## 2018-02-02 NOTE — Progress Notes (Addendum)
Subjective:  Patient ID: Daniel Kemp, male    DOB: March 05, 1961  Age: 57 y.o. MRN: [id number]  CC:  LE numbness  HPI  Daniel Kemp a 57 y.o.malewith a medical history of HTN, back pain with radiculopathy, anterior cervical decompression/discectomy and fusion at 2 levels presents with left hip pain with lower extremity numbness in the pretibial region. Had a fall at ground height. [place]are[place] Seat forwards without impact to back or hip. Does not know if the fall may have caused his current pain. Prescribed Gabapentin 100 mg TID but patient stopped taking due to side effect of somnolence. Stopped taking Meloxicam due to ineffective pain relief. Pt currently has appointment with neurology on 03/06/18. Not performing any stretches or therapy. Not taking anything for relief. Does not endorse any other symptoms or complaints.       Outpatient Medications Prior to Visit  Medication Sig Dispense Refill  . atorvastatin (LIPITOR) 40 MG tablet Take 1 tablet (40 mg total) by mouth at bedtime. 90 tablet 3  . cetirizine (ZYRTEC) 10 MG tablet Take 1 tablet (10 mg total) by mouth daily as needed. 30 tablet 5  . cyclobenzaprine (FLEXERIL) 5 MG tablet Take 1 tablet (5 mg total) by mouth at bedtime. 30 tablet 2  . fluticasone (FLONASE) 50 MCG/ACT nasal spray     . HYDROcodone-acetaminophen (NORCO/VICODIN) 5-325 MG per tablet Take 2 tablets by mouth every 8 (eight) hours as needed for pain. 10 tablet 0  . lisinopril-hydrochlorothiazide (PRINZIDE,ZESTORETIC) 10-12.5 MG tablet Take 2 tablets by mouth daily. 180 tablet 3  . meloxicam (MOBIC) 15 MG tablet Take 1 tablet (15 mg total) by mouth daily as needed for pain. 30 tablet 2  . NEXIUM 40 MG capsule Take 40 mg by mouth daily.    Marland Kitchen amitriptyline (ELAVIL) 10 MG tablet TAKE 1 TABLET BY MOUTH AT BEDTIME (Patient not taking: Reported on 02/02/2018) 90 tablet 1  . gabapentin (NEURONTIN) 100 MG capsule Take 100 mg by mouth 3 (three) times daily.  1   No  facility-administered medications prior to visit.      ROS Review of Systems  Constitutional: Negative for chills, fever and malaise/fatigue.  Eyes: Negative for blurred vision.  Respiratory: Negative for shortness of breath.   Cardiovascular: Negative for chest pain and palpitations.  Gastrointestinal: Negative for abdominal pain and nausea.  Genitourinary: Negative for dysuria and hematuria.  Musculoskeletal: Positive for joint pain. Negative for myalgias.  Skin: Negative for rash.  Neurological: Negative for tingling and headaches.  Psychiatric/Behavioral: Negative for depression. The patient is not nervous/anxious.     Objective:  BP 124/85 (BP Location: Right Arm, Patient Position: Sitting, Cuff Size: Large)   Pulse 77   Temp 97.7 F (36.5 C) (Oral)   Ht 6\' 4"  (1.93 m)   Wt 236 lb 6.4 oz (107.2 kg)   SpO2 96%   BMI 28.78 kg/m   BP/Weight 02/02/2018 01/02/2018 11/23/2017  Systolic BP 124 124 136  Diastolic BP 85 80 79  Wt. (Lbs) 236.4 238.4 239  BMI 28.78 29.02 29.09      Physical Exam  Constitutional: He is oriented to person, place, and time.  Well developed, well nourished, NAD, polite  HENT:  Head: Normocephalic and atraumatic.  Eyes: No scleral icterus.  Neck: Normal range of motion. Neck supple. No thyromegaly present.  Cardiovascular: Normal rate, regular rhythm and normal heart sounds.  Pulmonary/Chest: Effort normal and breath sounds normal.  Musculoskeletal: He exhibits no edema.  Full aROM of the  lower back and left hip. No muscular spasm or tenderness to palpation of the lower back.  Neurological: He is alert and oriented to person, place, and time. A sensory deficit (mild numbness on light touch sensation testing of the LLE in comparison to the RLE.) is present.  Strength 5/5 of the LLE. Normal gait.   Skin: Skin is warm and dry. No rash noted. No erythema. No pallor.  Psychiatric: He has a normal mood and affect. His behavior is normal. Thought content  normal.  Vitals reviewed.    Assessment & Plan:    1. Chronic left hip pain - DG Lumbar Spine Complete; Future - DG HIP UNILAT WITH PELVIS 2-3 VIEWS LEFT - pregabalin (LYRICA) 50 MG capsule; Take 1 capsule (50 mg total) by mouth 2 (two) times daily.  Dispense: 60 capsule; Refill: 2 - Recommence meloxicam you have at home.  - Keep appt with neurologist for NCS  2. Lower extremity numbness - DG Lumbar Spine Complete; Future - DG HIP UNILAT WITH PELVIS 2-3 VIEWS LEFT - pregabalin (LYRICA) 50 MG capsule; Take 1 capsule (50 mg total) by mouth 2 (two) times daily.  Dispense: 60 capsule; Refill: 2 - Recommence meloxicam you have at home.  - Keep appt with neurologist for NCS   Meds ordered this encounter  Medications  . pregabalin (LYRICA) 50 MG capsule    Sig: Take 1 capsule (50 mg total) by mouth 2 (two) times daily.    Dispense:  60 capsule    Refill:  2    Order Specific Question:   Supervising Provider    Answer:   Hoy RegisterNEWLIN, ENOBONG [[id number]]  . DISCONTD: meloxicam (MOBIC) 15 MG tablet    Sig: Take 1 tablet (15 mg total) by mouth daily.    Dispense:  30 tablet    Refill:  0    Order Specific Question:   Supervising Provider    Answer:   Hoy RegisterNEWLIN, ENOBONG [[id number]]    Follow-up: Return in about 8 weeks (around 03/30/2018) for hip pain and LLE numbness.   Loletta Specteroger David Deandre Brannan PA

## 2018-02-02 NOTE — Patient Instructions (Signed)

## 2018-02-07 ENCOUNTER — Telehealth (INDEPENDENT_AMBULATORY_CARE_PROVIDER_SITE_OTHER): Payer: Self-pay

## 2018-02-07 NOTE — Telephone Encounter (Signed)
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 He is aware to tylenol for pain and meloxicam for inflammation. He is also aware that left hip is normal. Maryjean Mornempestt S Roberts, CMA

## 2018-02-07 NOTE — Telephone Encounter (Signed)
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 Take tylenol for pain and meloxicam for inflammation. Normal left hip.

## 2018-03-06 ENCOUNTER — Ambulatory Visit (INDEPENDENT_AMBULATORY_CARE_PROVIDER_SITE_OTHER): Payer: Medicare Other | Admitting: Neurology

## 2018-03-06 ENCOUNTER — Encounter: Payer: Self-pay | Admitting: Neurology

## 2018-03-06 ENCOUNTER — Telehealth: Payer: Self-pay | Admitting: Neurology

## 2018-03-06 VITALS — BP 131/86 | HR 82 | Ht 76.0 in | Wt 238.0 lb

## 2018-03-06 DIAGNOSIS — R29898 Other symptoms and signs involving the musculoskeletal system: Secondary | ICD-10-CM | POA: Diagnosis not present

## 2018-03-06 DIAGNOSIS — M5416 Radiculopathy, lumbar region: Secondary | ICD-10-CM

## 2018-03-06 DIAGNOSIS — R200 Anesthesia of skin: Secondary | ICD-10-CM | POA: Diagnosis not present

## 2018-03-06 DIAGNOSIS — M5417 Radiculopathy, lumbosacral region: Secondary | ICD-10-CM | POA: Diagnosis not present

## 2018-03-06 MED ORDER — ALPRAZOLAM 0.25 MG PO TABS: ORAL_TABLET | ORAL | 0 refills | Status: DC

## 2018-03-06 NOTE — Patient Instructions (Signed)
Sciatica Sciatica is pain, numbness, weakness, or tingling along your sciatic nerve. The sciatic nerve starts in the lower back and goes down the back of each leg. Sciatica happens when this nerve is pinched or has pressure put on it. Sciatica usually goes away on its own or with treatment. Sometimes, sciatica may keep coming back (recur). Follow these instructions at home: Medicines  Take over-the-counter and prescription medicines only as told by your doctor.  Do not drive or use heavy machinery while taking prescription pain medicine. Managing pain  If directed, put ice on the affected area. ? Put ice in a plastic bag. ? Place a towel between your skin and the bag. ? Leave the ice on for 20 minutes, 2-3 times a day.  After icing, apply heat to the affected area before you exercise or as often as told by your doctor. Use the heat source that your doctor tells you to use, such as a moist heat pack or a heating pad. ? Place a towel between your skin and the heat source. ? Leave the heat on for 20-30 minutes. ? Remove the heat if your skin turns bright red. This is especially important if you are unable to feel pain, heat, or cold. You may have a greater risk of getting burned. Activity  Return to your normal activities as told by your doctor. Ask your doctor what activities are safe for you. ? Avoid activities that make your sciatica worse.  Take short rests during the day. Rest in a lying or standing position. This is usually better than sitting to rest. ? When you rest for a long time, do some physical activity or stretching between periods of rest. ? Avoid sitting for a long time without moving. Get up and move around at least one time each hour.  Exercise and stretch regularly, as told by your doctor.  Do not lift anything that is heavier than 10 lb (4.5 kg) while you have symptoms of sciatica. ? Avoid lifting heavy things even when you do not have symptoms. ? Avoid lifting heavy  things over and over.  When you lift objects, always lift in a way that is safe for your body. To do this, you should: ? Bend your knees. ? Keep the object close to your body. ? Avoid twisting. General instructions  Use good posture. ? Avoid leaning forward when you are sitting. ? Avoid hunching over when you are standing.  Stay at a healthy weight.  Wear comfortable shoes that support your feet. Avoid wearing high heels.  Avoid sleeping on a mattress that is too soft or too hard. You might have less pain if you sleep on a mattress that is firm enough to support your back.  Keep all follow-up visits as told by your doctor. This is important. Contact a doctor if:  You have pain that: ? Wakes you up when you are sleeping. ? Gets worse when you lie down. ? Is worse than the pain you have had in the past. ? Lasts longer than 4 weeks.  You lose weight for without trying. Get help right away if:  You cannot control when you pee (urinate) or poop (have a bowel movement).  You have weakness in any of these areas and it gets worse. ? Lower back. ? Lower belly (pelvis). ? Butt (buttocks). ? Legs.  You have redness or swelling of your back.  You have a burning feeling when you pee. This information is not intended to replace   advice given to you by your health care provider. Make sure you discuss any questions you have with your health care provider. Document Released: 04/26/2008 Document Revised: 12/24/2015 Document Reviewed: 03/27/2015 Elsevier Interactive Patient Education  2018 Elsevier Inc.  

## 2018-03-06 NOTE — Progress Notes (Signed)
[facility]    Provider:  Dr Lucia GaskinsAhern Referring Provider: Loletta SpecterGomez, Roger David, PA-C Primary Care Physician:  Loletta SpecterGomez, Roger David, PA-C  CC:  Left leg pain  HPI:  Daniel Kemp is a 57 y.o. male here as a referral from Dr. Lily KocherGomez for LE weakness.  Past medical history hypertension, back pain with radiculopathy, anterior cervical decompression discectomy and fusion 2 levels. He fell months ago but this had nothing to do with the fall. He started having pain and pressure on the left side of the leg mid June, He woke up with it and the left lower leg is numb. Also pain from the left hip radiating down the leg, also chronic low back pain, Radiated down the side and back of the leg down to the lower half of the leg but not the foot. He has pain on movement and walking. Better laying on the other side, excruciating in the morning. He has started on Lyrica and amitriptyline and improved but still there and weakness. Had tried conservative measures for 6 weeks, medications, stretching , heat.  Reviewed notes, labs and imaging from outside physicians, which showed:  Reviewed referring physician notes patient presented with left hip pain with lower extremity numbness in the pretibial region had a fall at ground height does not know if the fall may have caused his current pain prescribed gabapentin but made him somnolent stop taking meloxicam due to an infected pain relief does not endorse any other symptoms or complaints.  Exam showed a sensory deficit mild numbness on light touch sensation of the left lower extremity comparison to the right lower extremity.  Strength was 5 out of 5 with normal gait.  No muscular spasm or tenderness to palpation of the lower back.  Reviewed xr report of back with degenerative changes and disease  Review of Systems: Patient complains of symptoms per HPI as well as the following symptoms: numbness, aching muscles, allergies. Pertinent negatives and positives  per HPI. All others negative.   Social History   Socioeconomic History  . Marital status: Married    Spouse name: Not on file  . Number of children: 4  . 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 Highest education level: Some college, no degree  Occupational History  . Not on file  Social Needs  . Financial resource strain: Not on file  . Food insecurity:    Worry: Not on file    Inability: Not on file  . Transportation needs:    Medical: Not on file    Non-medical: Not on file  Tobacco Use  . Smoking status: Former Smoker    Packs/day: 0.50    Types: Cigarettes    Last attempt to quit: 01/02/2013    Years since quitting: 5.1  . Smokeless tobacco: Never Used  Substance and Sexual Activity  . Alcohol use: Yes    Comment: occasional beer  . Drug use: No    Comment: prescription hydrocodone  . Sexual activity: Not on file  Lifestyle  . Physical activity:    Days per week: Not on file    Minutes per session: Not on file  . Stress: Not on file  Relationships  . Social connections:    Talks on phone: Not on file    Gets together: Not on file    Attends religious service: Not on file    Active member of club or organization: Not on file    Attends meetings of clubs or organizations: Not  on file    Relationship status: Not on file  . Intimate partner violence:    Fear of current or ex partner: Not on file    Emotionally abused: Not on file    Physically abused: Not on file    Forced sexual activity: Not on file  Other Topics Concern  . Not on file  Social History Narrative   Lives at home with wife & son who is 107 y.o.   Left handed   Drinks 1 cup of caffeine daily    Family History  Problem Relation Age of Onset  . Stomach cancer Mother   . Prostate cancer Father     Past Medical History:  Diagnosis Date  . Arm pain, right   . Arthritis   . Back pain with radiation   . Headache(784.0)   . High cholesterol     Past Surgical History:  Procedure  Laterality Date  . ANTERIOR CERVICAL DECOMP/DISCECTOMY FUSION  07/28/2011   Procedure: ANTERIOR CERVICAL DECOMPRESSION/DISCECTOMY FUSION 2 LEVELS;  Surgeon: [facility]ie Pollock [facility];  Location: [facility] OR;  Service: Orthopedics;  Laterality: N/A;  ACDF C6-7 and C7-T1  . HAND ARTHROPLASTY      Current Outpatient Medications  Medication Sig Dispense Refill  . amitriptyline (ELAVIL) 10 MG tablet TAKE 1 TABLET BY MOUTH AT BEDTIME 90 tablet 1  . atorvastatin (LIPITOR) 40 MG tablet Take 1 tablet (40 mg total) by mouth at bedtime. 90 tablet 3  . cetirizine (ZYRTEC) 10 MG tablet Take 1 tablet (10 mg total) by mouth daily as needed. 30 tablet 5  . cyclobenzaprine (FLEXERIL) 10 MG tablet Take 10 mg by mouth as needed for muscle spasms.    Marland Kitchen HYDROcodone-acetaminophen (NORCO/VICODIN) 5-325 MG per tablet Take 2 tablets by mouth every 8 (eight) hours as needed for pain. 10 tablet 0  . lisinopril-hydrochlorothiazide (PRINZIDE,ZESTORETIC) 10-12.5 MG tablet Take 2 tablets by mouth daily. 180 tablet 3  . meloxicam (MOBIC) 15 MG tablet Take 15 mg by mouth daily.  5  . NEXIUM 40 MG capsule Take 40 mg by mouth daily.    . pregabalin (LYRICA) 50 MG capsule Take 1 capsule (50 mg total) by mouth 2 (two) times daily. 60 capsule 2  . ALPRAZolam (XANAX) 0.25 MG tablet Take 1-2 tabs (0.25mg -0.50mg ) 30-60 minutes before procedure. May repeat if needed.Do not drive. 4 tablet 0  . fluticasone (FLONASE) 50 MCG/ACT nasal spray Place into both nostrils as needed.      No current facility-administered medications for this visit.     Allergies as of 03/06/2018 - Review Complete 03/06/2018  Allergen Reaction Noted  . Fish-derived products Swelling 07/25/2011  . Peanut-containing drug products Swelling 07/25/2011    Vitals: BP 131/86 (BP Location: Right Arm, Patient Position: Sitting)   Pulse 82   Ht 6\' 4"  (1.93 m)   Wt 238 lb (108 kg)   BMI 28.97 kg/m  Last Weight:  Wt Readings from Last 1 Encounters:  03/06/18 238 lb (108  kg)   Last Height:   Ht Readings from Last 1 Encounters:  03/06/18 6\' 4"  (1.93 m)   Physical exam: Exam: Gen: NAD, conversant, well nourised, well groomed                     CV: RRR, no MRG. No Carotid Bruits. No peripheral edema, warm, nontender Eyes: Conjunctivae clear without exudates or hemorrhage  Neuro: Detailed Neurologic Exam  Speech:    Speech is normal; fluent and spontaneous with normal  comprehension.  Cognition:    The patient is oriented to person, place, and time;     recent and remote memory intact;     language fluent;     normal attention, concentration,     fund of knowledge Cranial Nerves:    The pupils are equal, round, and reactive to light. The fundi are normal and spontaneous venous pulsations are present. Visual fields are full to finger confrontation. Extraocular movements are intact. Trigeminal sensation is intact and the muscles of mastication are normal. The face is symmetric. The palate elevates in the midline. Hearing intact. Voice is normal. Shoulder shrug is normal. The tongue has normal motion without fasciculations.   Coordination:    Normal finger to nose and heel to shin. Normal rapid alternating movements.   Gait:    Heel-toe and tandem gait are normal.   Motor Observation:    No asymmetry, no atrophy, and no involuntary movements noted. Tone:    Normal muscle tone.    Posture:    Posture is normal. normal erect    Strength: Lft 5-/5 hip flexion and 3+/5 leg flexion otherwise strength is V/V in the upper and lower limbs.      Sensation: decreased sensation below the left leg from the knee down in no dermatomal distribuion     Reflex Exam:  DTR's:    Deep tendon reflexes in the upper and lower extremities are normal bilaterally.   Toes:    The toes are downgoing bilaterally.   Clonus:    Clonus is absent.       Assessment/Plan:  57 year old with radicular pain in the left leg unclear where the nerve impingement in the low  back or possibly at the hip.   MRI Lumbar spine, weakness of the left leg, failed > 6 weeks conservative measures.  Fall risk, discussed. Refer to Dr. Yevette Edwards as he did his cervical surgery  Orders Placed This Encounter  Procedures  . MR LUMBAR SPINE WO CONTRAST  . Ambulatory referral to Orthopedic Surgery   Meds ordered this encounter  Medications  . ALPRAZolam (XANAX) 0.25 MG tablet    Sig: Take 1-2 tabs (0.25mg -0.50mg ) 30-60 minutes before procedure. May repeat if needed.Do not drive.    Dispense:  4 tablet    Refill:  0      Naomie Dean, MD  [facility] [address] [place], [place] [address]  Phone [phone] Fax [phone]

## 2018-03-06 NOTE — Telephone Encounter (Signed)
Medicare order sent to [facility]. No auth they will reach out to the pt to schedule.  °

## 2018-03-08 ENCOUNTER — Other Ambulatory Visit (INDEPENDENT_AMBULATORY_CARE_PROVIDER_SITE_OTHER): Payer: Self-pay | Admitting: Physician Assistant

## 2018-03-08 ENCOUNTER — Telehealth: Payer: Self-pay | Admitting: Neurology

## 2018-03-08 DIAGNOSIS — R200 Anesthesia of skin: Secondary | ICD-10-CM

## 2018-03-08 NOTE — Telephone Encounter (Signed)
Totally agree with your assessment and advice, thanks

## 2018-03-08 NOTE — Telephone Encounter (Signed)
Pt requesting a call stating he has a few concerns with taking ALPRAZolam (XANAX) 0.25 MG tablet alongside with his current medications. Please call to advise

## 2018-03-08 NOTE — Telephone Encounter (Signed)
FWD to PCP. Tempestt S Roberts, CMA  

## 2018-03-08 NOTE — Telephone Encounter (Signed)
Spoke with patient. He was concerned about taking the Xanax for his MRI along with the Hydrocodone that he usually takes for pain. RN advised it is best not to take these at the same time due to potentially effects of sleepiness and/or respiratory compromise. Patient verbalized understanding. He decided he will take the Xanax in the morning for his MRI and then take his pain medication in the evening. He will d/w his pain management doctor tomorrow in office about the plan and will call back if he has any concerns. Patient appreciative.

## 2018-03-10 ENCOUNTER — Ambulatory Visit
Admission: RE | Admit: 2018-03-10 | Discharge: 2018-03-10 | Disposition: A | Discharge status: Home / Self Care | Payer: Medicare Other | Source: Ambulatory Visit | Attending: Neurology | Admitting: Neurology

## 2018-03-10 DIAGNOSIS — R200 Anesthesia of skin: Secondary | ICD-10-CM | POA: Diagnosis not present

## 2018-03-10 DIAGNOSIS — M5416 Radiculopathy, lumbar region: Secondary | ICD-10-CM | POA: Diagnosis not present

## 2018-03-10 DIAGNOSIS — R29898 Other symptoms and signs involving the musculoskeletal system: Secondary | ICD-10-CM | POA: Diagnosis not present

## 2018-03-14 ENCOUNTER — Telehealth: Payer: Self-pay | Admitting: *Deleted

## 2018-03-14 NOTE — Telephone Encounter (Signed)
-----   Message from Anson FretAntonia B Ahern, MD sent at 03/12/2018  1:08 PM EDT ----- Looks like he has a pinched nerve in his low back,  left L5 nerve root compression. I have already referred him to Orthopaedics. Make sure he brings a CD with the images on them as I am not sure Dr. Yevette Edwardsumonski has access to Epic or not to see the MRI pictures of low back thanks

## 2018-03-14 NOTE — Telephone Encounter (Signed)
Spoke to patient - he is aware of the results.  He has his MRI disc and will take it with him to his 8/20/a0 appt with Dr. Yevette Edwardsumonski.

## 2018-03-30 ENCOUNTER — Ambulatory Visit (INDEPENDENT_AMBULATORY_CARE_PROVIDER_SITE_OTHER): Payer: Medicare Other | Admitting: Physician Assistant

## 2018-03-30 ENCOUNTER — Other Ambulatory Visit: Payer: Self-pay

## 2018-03-30 ENCOUNTER — Encounter (INDEPENDENT_AMBULATORY_CARE_PROVIDER_SITE_OTHER): Payer: Self-pay | Admitting: Physician Assistant

## 2018-03-30 VITALS — BP 133/85 | HR 74 | Temp 97.8°F | Ht 76.0 in | Wt 238.6 lb

## 2018-03-30 DIAGNOSIS — Z23 Encounter for immunization: Secondary | ICD-10-CM | POA: Diagnosis not present

## 2018-03-30 DIAGNOSIS — M48061 Spinal stenosis, lumbar region without neurogenic claudication: Secondary | ICD-10-CM | POA: Diagnosis not present

## 2018-03-30 DIAGNOSIS — R200 Anesthesia of skin: Secondary | ICD-10-CM

## 2018-03-30 MED ORDER — PREGABALIN 75 MG PO CAPS: 75.0000 mg | ORAL_CAPSULE | Freq: Two times a day (BID) | ORAL | 1 refills | Status: DC

## 2018-03-30 MED ORDER — MELOXICAM 15 MG PO TABS: 15.0000 mg | ORAL_TABLET | Freq: Every day | ORAL | 0 refills | Status: DC

## 2018-03-30 MED ORDER — AMITRIPTYLINE HCL 10 MG PO TABS: 10.0000 mg | ORAL_TABLET | Freq: Every day | ORAL | 1 refills | Status: DC

## 2018-03-30 NOTE — Patient Instructions (Signed)
Td Vaccine (Tetanus and Diphtheria): What You Need to Know 1. Why get vaccinated? Tetanus  and diphtheria are very serious diseases. They are rare in the [place] today, but people who do become infected often have severe complications. Td vaccine is used to protect adolescents and adults from both of these diseases. Both tetanus and diphtheria are infections caused by bacteria. Diphtheria spreads from person to person through coughing or sneezing. Tetanus-causing bacteria enter the body through cuts, scratches, or wounds. TETANUS (lockjaw) causes painful muscle tightening and stiffness, usually all over the body.  It can lead to tightening of muscles in the head and neck so you can't open your mouth, swallow, or sometimes even breathe. Tetanus kills about 1 out of every 10 people who are infected even after receiving the best medical care.  DIPHTHERIA can cause a thick coating to form in the back of the throat.  It can lead to breathing problems, paralysis, heart failure, and death.  Before vaccines, as many as 200,000 cases of diphtheria and hundreds of cases of tetanus were reported in the [place] each year. Since vaccination began, reports of cases for both diseases have dropped by about 99%. 2. Td vaccine Td vaccine can protect adolescents and adults from tetanus and diphtheria. Td is usually given as a booster dose every 10 years but it can also be given earlier after a severe and dirty wound or burn. Another vaccine, called Tdap, which protects against pertussis in addition to tetanus and diphtheria, is sometimes recommended instead of Td vaccine. Your doctor or the person giving you the vaccine can give you more information. Td may safely be given at the same time as other vaccines. 3. Some people should not get this vaccine  A person who has ever had a life-threatening allergic reaction after a previous dose of any tetanus or diphtheria containing vaccine, OR has a severe  allergy to any part of this vaccine, should not get Td vaccine. Tell the person giving the vaccine about any severe allergies.  Talk to your doctor if you: ? had severe pain or swelling after any vaccine containing diphtheria or tetanus, ? ever had a condition called Guillain Barre Syndrome (GBS), ? aren't feeling well on the day the shot is scheduled. 4. What are the risks from Td vaccine? With any medicine, including vaccines, there is a chance of side effects. These are usually mild and go away on their own. Serious reactions are also possible but are rare. Most people who get Td vaccine do not have any problems with it. Mild problems following Td vaccine: (Did not interfere with activities)  Pain where the shot was given (about 8 people in 10)  Redness or swelling where the shot was given (about 1 person in 4)  Mild fever (rare)  Headache (about 1 person in 4)  Tiredness (about 1 person in 4)  Moderate problems following Td vaccine: (Interfered with activities, but did not require medical attention)  Fever over 102F (rare)  Severe problems following Td vaccine: (Unable to perform usual activities; required medical attention)  Swelling, severe pain, bleeding and/or redness in the arm where the shot was given (rare).  Problems that could happen after any vaccine:  People sometimes faint after a medical procedure, including vaccination. Sitting or lying down for about 15 minutes can help prevent fainting, and injuries caused by a fall. Tell your doctor if you feel dizzy, or have vision changes or ringing in the ears.  Some people get   severe pain in the shoulder and have difficulty moving the arm where a shot was given. This happens very rarely.  Any medication can cause a severe allergic reaction. Such reactions from a vaccine are very rare, estimated at fewer than 1 in a million doses, and would happen within a few minutes to a few hours after the vaccination. As with any  medicine, there is a very remote chance of a vaccine causing a serious injury or death. The safety of vaccines is always being monitored. For more information, visit: www.cdc.gov/vaccinesafety/ 5. What if there is a serious reaction? What should I look for? Look for anything that concerns you, such as signs of a severe allergic reaction, very high fever, or unusual behavior. Signs of a severe allergic reaction can include hives, swelling of the face and throat, difficulty breathing, a fast heartbeat, dizziness, and weakness. These would usually start a few minutes to a few hours after the vaccination. What should I do?  If you think it is a severe allergic reaction or other emergency that can't wait, call 9-1-1 or get the person to the nearest hospital. Otherwise, call your doctor.  Afterward, the reaction should be reported to the Vaccine Adverse Event Reporting System (VAERS). Your doctor might file this report, or you can do it yourself through the VAERS web site at www.vaers.hhs.gov, or by calling 1-800-822-7967. ? VAERS does not give medical advice. 6. The National Vaccine Injury Compensation Program The National Vaccine Injury Compensation Program (VICP) is a federal program that was created to compensate people who may have been injured by certain vaccines. Persons who believe they may have been injured by a vaccine can learn about the program and about filing a claim by calling 1-800-338-2382 or visiting the VICP website at www.hrsa.gov/vaccinecompensation. There is a time limit to file a claim for compensation. 7. How can I learn more?  Ask your doctor. He or she can give you the vaccine package insert or suggest other sources of information.  Call your local or state health department.  Contact the Centers for Disease Control and Prevention (CDC): ? Call 1-800-232-4636 (1-800-CDC-INFO) ? Visit CDC's website at www.cdc.gov/vaccines CDC Td Vaccine VIS (11/10/15) This information is  not intended to replace advice given to you by your health care provider. Make sure you discuss any questions you have with your health care provider. Document Released: 05/15/2006 Document Revised: 04/07/2016 Document Reviewed: 04/07/2016 Elsevier Interactive Patient Education  2017 Elsevier Inc.  

## 2018-03-30 NOTE — Progress Notes (Signed)
Subjective:  Patient ID: Daniel Kemp, male    DOB: Jun 24, 1961  Age: 57 y.o. MRN: [id number]  CC: f/u hip pain and LLE numbness  HPI Daniel Kemp a 57 y.o.malewith a medical history of HTN, back pain with radiculopathy, anterior cervical decompression/discectomy and fusion at 2 levels presents to f/u on left hip pain with lower extremity numbness in the pretibial region. Went to neurology on 03/06/18 and had an MRI ordered and revealed multilevel degenerative change. At L3-L4, there is mild spinal stenosis and moderate left greater than right foraminal narrowing and bilateral lateral recess stenosis.  There is no definite nerve root compression. At L4-L5, there is mild spinal stenosis, moderate left greater than right foraminal narrowing, moderately severe left lateral recess stenosis and moderate right lateral recess stenosis.  There is potential for left L5 nerve root compression. . Was also referred to orthopedic surgeon by the neurologist. Saw the orthopedic surgeon and will receive steroid injections into the back on 04/18/18. Reports his pain level has reduced to a 4/10 with lyrica 50 mg BID, amitriptyline 10 mg, and Norco 5-325 mg two tabs q8 hrs. Still going to pain management clinic. Does not endorse any other symptoms or complaints.      Outpatient Medications Prior to Visit  Medication Sig Dispense Refill  . amitriptyline (ELAVIL) 10 MG tablet TAKE 1 TABLET BY MOUTH AT BEDTIME 90 tablet 1  . atorvastatin (LIPITOR) 40 MG tablet Take 1 tablet (40 mg total) by mouth at bedtime. 90 tablet 3  . cetirizine (ZYRTEC) 10 MG tablet Take 1 tablet (10 mg total) by mouth daily as needed. 30 tablet 5  . cyclobenzaprine (FLEXERIL) 10 MG tablet Take 10 mg by mouth as needed for muscle spasms.    Marland Kitchen. HYDROcodone-acetaminophen (NORCO/VICODIN) 5-325 MG per tablet Take 2 tablets by mouth every 8 (eight) hours as needed for pain. 10 tablet 0  . lisinopril-hydrochlorothiazide (PRINZIDE,ZESTORETIC)  10-12.5 MG tablet Take 2 tablets by mouth daily. 180 tablet 3  . meloxicam (MOBIC) 15 MG tablet Take 15 mg by mouth daily.  5  . NEXIUM 40 MG capsule Take 40 mg by mouth daily.    . pregabalin (LYRICA) 50 MG capsule Take 1 capsule (50 mg total) by mouth 2 (two) times daily. 60 capsule 2  . fluticasone (FLONASE) 50 MCG/ACT nasal spray Place into both nostrils as needed.     . ALPRAZolam (XANAX) 0.25 MG tablet Take 1-2 tabs (0.25mg -0.50mg ) 30-60 minutes before procedure. May repeat if needed.Do not drive. 4 tablet 0   No facility-administered medications prior to visit.      ROS Review of Systems  Constitutional: Negative for chills, fever and malaise/fatigue.  Eyes: Negative for blurred vision.  Respiratory: Negative for shortness of breath.   Cardiovascular: Negative for chest pain and palpitations.  Gastrointestinal: Negative for abdominal pain and nausea.  Genitourinary: Negative for dysuria and hematuria.  Musculoskeletal: Positive for back pain. Negative for joint pain and myalgias.  Skin: Negative for rash.  Neurological: Negative for tingling and headaches.       Tingling of LLE  Psychiatric/Behavioral: Negative for depression. The patient is not nervous/anxious.     Objective:  BP 133/85 (BP Location: Left Arm, Patient Position: Sitting, Cuff Size: Large)   Pulse 74   Temp 97.8 F (36.6 C) (Oral)   Ht 6\' 4"  (1.93 m)   Wt 238 lb 9.6 oz (108.2 kg)   SpO2 96%   BMI 29.04 kg/m   BP/Weight 03/30/2018 03/06/2018  02/02/2018  Systolic BP 133 131 124  Diastolic BP 85 86 85  Wt. (Lbs) 238.6 238 236.4  BMI 29.04 28.97 28.78      Physical Exam  Constitutional: He is oriented to person, place, and time.  Well developed, well nourished, NAD, polite  HENT:  Head: Normocephalic and atraumatic.  Eyes: No scleral icterus.  Neck: Normal range of motion. Neck supple. No thyromegaly present.  Cardiovascular: Normal rate, regular rhythm and normal heart sounds.  Pulmonary/Chest:  Effort normal and breath sounds normal.  Musculoskeletal: He exhibits no edema.  LLE with full aROM  Neurological: He is alert and oriented to person, place, and time.  Gait normal  Skin: Skin is warm and dry. No rash noted. No erythema. No pallor.  Psychiatric: He has a normal mood and affect. His behavior is normal. Thought content normal.  Vitals reviewed.    Assessment & Plan:   1. Spinal stenosis of lumbar region without neurogenic claudication - pregabalin (LYRICA) 75 MG capsule; Take 1 capsule (75 mg total) by mouth 2 (two) times daily.  Dispense: 180 capsule; Refill: 1 - meloxicam (MOBIC) 15 MG tablet; Take 1 tablet (15 mg total) by mouth daily.  Dispense: 90 tablet; Refill: 0 - amitriptyline (ELAVIL) 10 MG tablet; Take 1 tablet (10 mg total) by mouth at bedtime.  Dispense: 90 tablet; Refill: 1  2. Lower extremity numbness - pregabalin (LYRICA) 75 MG capsule; Take 1 capsule (75 mg total) by mouth 2 (two) times daily.  Dispense: 180 capsule; Refill: 1 - meloxicam (MOBIC) 15 MG tablet; Take 1 tablet (15 mg total) by mouth daily.  Dispense: 90 tablet; Refill: 0 - amitriptyline (ELAVIL) 10 MG tablet; Take 1 tablet (10 mg total) by mouth at bedtime.  Dispense: 90 tablet; Refill: 1  3. Need for Tdap vaccination - Tdap vaccine greater than or equal to 7yo IM   Meds ordered this encounter  Medications  . pregabalin (LYRICA) 75 MG capsule    Sig: Take 1 capsule (75 mg total) by mouth 2 (two) times daily.    Dispense:  180 capsule    Refill:  1    Order Specific Question:   Supervising Provider    Answer:   Hoy Register [[id number]]  . meloxicam (MOBIC) 15 MG tablet    Sig: Take 1 tablet (15 mg total) by mouth daily.    Dispense:  90 tablet    Refill:  0    Order Specific Question:   Supervising Provider    Answer:   Hoy Register [[id number]]  . amitriptyline (ELAVIL) 10 MG tablet    Sig: Take 1 tablet (10 mg total) by mouth at bedtime.    Dispense:  90 tablet    Refill:  1     **Patient requests 90 days supply**    Order Specific Question:   Supervising Provider    Answer:   Hoy Register [[id number]]    Follow-up: Return if symptoms worsen or fail to improve.   Loletta Specter PA

## 2018-08-06 ENCOUNTER — Ambulatory Visit (HOSPITAL_COMMUNITY)
Admission: EM | Admit: 2018-08-06 | Discharge: 2018-08-06 | Disposition: A | Discharge status: Home / Self Care | Payer: Medicare Other | Attending: Internal Medicine | Admitting: Internal Medicine

## 2018-08-06 ENCOUNTER — Other Ambulatory Visit: Payer: Self-pay

## 2018-08-06 ENCOUNTER — Encounter (HOSPITAL_COMMUNITY): Payer: Self-pay | Admitting: Emergency Medicine

## 2018-08-06 DIAGNOSIS — J111 Influenza due to unidentified influenza virus with other respiratory manifestations: Secondary | ICD-10-CM

## 2018-08-06 DIAGNOSIS — R69 Illness, unspecified: Secondary | ICD-10-CM | POA: Diagnosis not present

## 2018-08-06 HISTORY — DX: Essential (primary) hypertension: I10

## 2018-08-06 MED ORDER — CETIRIZINE-PSEUDOEPHEDRINE ER 5-120 MG PO TB12: 1.0000 | ORAL_TABLET | Freq: Every day | ORAL | 0 refills | Status: DC

## 2018-08-06 MED ORDER — GUAIFENESIN-DM 100-10 MG/5ML PO SYRP: 5.0000 mL | ORAL_SOLUTION | ORAL | 0 refills | Status: DC | PRN

## 2018-08-06 MED ORDER — OSELTAMIVIR PHOSPHATE 75 MG PO CAPS: 75.0000 mg | ORAL_CAPSULE | Freq: Two times a day (BID) | ORAL | 0 refills | Status: DC

## 2018-08-06 MED ORDER — IBUPROFEN 600 MG PO TABS: 600.0000 mg | ORAL_TABLET | Freq: Four times a day (QID) | ORAL | 0 refills | Status: DC | PRN

## 2018-08-06 NOTE — Discharge Instructions (Signed)
You likely having a viral upper respiratory infection. We recommended symptom control. I expect your symptoms to start improving in the next 1-2 weeks.   Tamiflu twice daily for 5 days  1. Take a daily allergy pill/anti-histamine like Zyrtec, Claritin, or Store brand consistently for 2 weeks  2. For congestion you may try an oral decongestant like Mucinex or sudafed. You may also try intranasal flonase nasal spray or saline irrigations (neti pot, sinus cleanse)  3. For your sore throat you may try cepacol lozenges, salt water gargles, throat spray. Treatment of congestion may also help your sore throat.  4. For cough you may try Robitussin DM provided, or Delsym,  Mucinex DM  5. Take Tylenol or Ibuprofen to help with pain/inflammation  6. Stay hydrated, drink plenty of fluids to keep throat coated and less irritated  Honey Tea For cough/sore throat try using a honey-based tea. Use 3 teaspoons of honey with juice squeezed from half lemon. Place shaved pieces of ginger into 1/2-1 cup of water and warm over stove top. Then mix the ingredients and repeat every 4 hours as needed.

## 2018-08-06 NOTE — ED Triage Notes (Signed)
Patient reports onset Saturday night.  Symptoms include chills, fever ( felt warm), poor appetite and headache

## 2018-08-06 NOTE — ED Provider Notes (Signed)
[facility]    CSN: [id number] Arrival date & time: 08/06/18  0906     History   Chief Complaint Chief Complaint  Patient presents with  . URI    HPI Daniel Kemp is a 58 y.o. male history of hypertension, hyperlipidemia, Patient is presenting with URI symptoms- congestion, cough, .  Patient is also had subjective fevers, chills, decreased appetite and feeling weak.  He has had very low energy and difficulty getting out of bed over the past couple days patient's main complaints are overall feeling poor. Symptoms have been going on for 2 days, began Saturday evening. Patient has tried ibuprofen, with minimal relief. Denies fever, nausea, vomiting, diarrhea. Denies shortness of breath and chest pain.   HPI  Past Medical History:  Diagnosis Date  . Arm pain, right   . Arthritis   . Back pain with radiation   . Headache(784.0)   . High cholesterol   . Hypertension     Patient Active Problem List   Diagnosis Date Noted  . Lumbosacral radiculopathy at L5 03/06/2018    Past Surgical History:  Procedure Laterality Date  . ANTERIOR CERVICAL DECOMP/DISCECTOMY FUSION  07/28/2011   Procedure: ANTERIOR CERVICAL DECOMPRESSION/DISCECTOMY FUSION 2 LEVELS;  Surgeon: [facility];  Location: [facility] OR;  Service: Orthopedics;  Laterality: N/A;  ACDF C6-7 and C7-T1  . HAND ARTHROPLASTY         Home Medications    Prior to Admission medications   Medication Sig Start Date End Date Taking? Authorizing Provider  atorvastatin (LIPITOR) 40 MG tablet Take 1 tablet (40 mg total) by mouth at bedtime. 11/23/17  Yes Loletta Specter, PA-C  cetirizine (ZYRTEC) 10 MG tablet Take 1 tablet (10 mg total) by mouth daily as needed. 11/23/17  Yes Loletta Specter, PA-C  cyclobenzaprine (FLEXERIL) 10 MG tablet Take 10 mg by mouth as needed for muscle spasms.   Yes [provider]  HYDROcodone-acetaminophen (NORCO/VICODIN) 5-325 MG per tablet Take 2 tablets by mouth every 8  (eight) hours as needed for pain. 10/22/12  Yes Gerhard Munch, MD  lisinopril-hydrochlorothiazide (PRINZIDE,ZESTORETIC) 10-12.5 MG tablet Take 2 tablets by mouth daily. 11/23/17  Yes Loletta Specter, PA-C  meloxicam (MOBIC) 15 MG tablet Take 1 tablet (15 mg total) by mouth daily. 03/30/18  Yes Loletta Specter, PA-C  NEXIUM 40 MG capsule Take 40 mg by mouth daily. 08/21/17  Yes [provider]  cetirizine-pseudoephedrine (ZYRTEC-D) 5-120 MG tablet Take 1 tablet by mouth daily. 08/06/18   Jennie Hannay C, PA-C  guaiFENesin-dextromethorphan (ROBITUSSIN DM) 100-10 MG/5ML syrup Take 5 mLs by mouth every 4 (four) hours as needed for cough. 08/06/18   Andalyn Heckstall C, PA-C  ibuprofen (ADVIL,MOTRIN) 600 MG tablet Take 1 tablet (600 mg total) by mouth every 6 (six) hours as needed. 08/06/18   Daniil Labarge C, PA-C  oseltamivir (TAMIFLU) 75 MG capsule Take 1 capsule (75 mg total) by mouth every 12 (twelve) hours. 08/06/18   Ersie Savino, Junius Creamer, PA-C    Family History Family History  Problem Relation Age of Onset  . Stomach cancer Mother   . Prostate cancer Father     Social History Social History   Tobacco Use  . Smoking status: Former Smoker    Packs/day: 0.50    Types: Cigarettes    Last attempt to quit: 01/02/2013    Years since quitting: 5.5  . Smokeless tobacco: Never Used  Substance Use Topics  . Alcohol use: Yes  Comment: occasional beer  . Drug use: No    Comment: prescription hydrocodone     Allergies   Fish-derived products and Peanut-containing drug products   Review of Systems Review of Systems  Constitutional: Positive for appetite change, chills, fatigue and fever. Negative for activity change.  HENT: Positive for congestion and rhinorrhea. Negative for ear pain, sinus pressure, sore throat and trouble swallowing.   Eyes: Negative for discharge and redness.  Respiratory: Positive for cough. Negative for chest tightness and shortness of breath.     Cardiovascular: Negative for chest pain.  Gastrointestinal: Negative for abdominal pain, diarrhea, nausea and vomiting.  Musculoskeletal: Positive for myalgias.  Skin: Negative for rash.  Neurological: Positive for headaches. Negative for dizziness and light-headedness.     Physical Exam Triage Vital Signs ED Triage Vitals  Enc Vitals Group     BP 08/06/18 0934 114/78     Pulse Rate 08/06/18 0934 86     Resp 08/06/18 0934 16     Temp 08/06/18 0934 98.4 F (36.9 C)     Temp Source 08/06/18 0934 Oral     SpO2 08/06/18 0934 100 %     Weight --      Height --      Head Circumference --      Peak Flow --      Pain Score 08/06/18 0937 4     Pain Loc --      Pain Edu? --      Excl. in GC? --    No data found.  Updated Vital Signs BP 114/78 (BP Location: Left Arm)   Pulse 86   Temp 98.4 F (36.9 C) (Oral)   Resp 16   SpO2 100%   Visual Acuity Right Eye Distance:   Left Eye Distance:   Bilateral Distance:    Right Eye Near:   Left Eye Near:    Bilateral Near:     Physical Exam Vitals signs and nursing note reviewed.  Constitutional:      Appearance: He is well-developed.  HENT:     Head: Normocephalic and atraumatic.     Ears:     Comments: Bilateral ears without tenderness to palpation of external auricle, tragus and mastoid, EAC's without erythema or swelling, TM's with good bony landmarks and cone of light. Non erythematous.    Nose:     Comments: Nasal mucosa erythematous, no rhinorrhea bilaterally    Mouth/Throat:     Comments: Oral mucosa pink and moist, no tonsillar enlargement or exudate. Posterior pharynx patent and nonerythematous, no uvula deviation or swelling. Normal phonation. Eyes:     Conjunctiva/sclera: Conjunctivae normal.  Neck:     Musculoskeletal: Neck supple.  Cardiovascular:     Rate and Rhythm: Normal rate and regular rhythm.     Heart sounds: No murmur.  Pulmonary:     Effort: Pulmonary effort is normal. No respiratory distress.      Breath sounds: Normal breath sounds.     Comments: Breathing comfortably at rest, CTABL, no wheezing, rales or other adventitious sounds auscultated Abdominal:     Palpations: Abdomen is soft.     Tenderness: There is no abdominal tenderness.  Skin:    General: Skin is warm and dry.  Neurological:     Mental Status: He is alert.      UC Treatments / Results  Labs (all labs ordered are listed, but only abnormal results are displayed) Labs Reviewed - No data to display  EKG None  Radiology No results found.  Procedures Procedures (including critical care time)  Medications Ordered in UC Medications - No data to display  Initial Impression / Assessment and Plan / UC Course  I have reviewed the triage vital signs and the nursing notes.  Pertinent labs & imaging results that were available during my care of the patient were reviewed by me and considered in my medical decision making (see chart for details).     Vital signs stable, patient with flulike symptoms for less than 48 hours, will initiate treatment with Tamiflu and recommend further symptomatic and supportive care.  Recommendations below.Discussed strict return precautions. Patient verbalized understanding and is agreeable with plan.  Final Clinical Impressions(s) / UC Diagnoses   Final diagnoses:  Influenza-like illness     Discharge Instructions     You likely having a viral upper respiratory infection. We recommended symptom control. I expect your symptoms to start improving in the next 1-2 weeks.   Tamiflu twice daily for 5 days  1. Take a daily allergy pill/anti-histamine like Zyrtec, Claritin, or Store brand consistently for 2 weeks  2. For congestion you may try an oral decongestant like Mucinex or sudafed. You may also try intranasal flonase nasal spray or saline irrigations (neti pot, sinus cleanse)  3. For your sore throat you may try cepacol lozenges, salt water gargles, throat spray. Treatment  of congestion may also help your sore throat.  4. For cough you may try Robitussin DM provided, or Delsym,  Mucinex DM  5. Take Tylenol or Ibuprofen to help with pain/inflammation  6. Stay hydrated, drink plenty of fluids to keep throat coated and less irritated  Honey Tea For cough/sore throat try using a honey-based tea. Use 3 teaspoons of honey with juice squeezed from half lemon. Place shaved pieces of ginger into 1/2-1 cup of water and warm over stove top. Then mix the ingredients and repeat every 4 hours as needed.   ED Prescriptions    Medication Sig Dispense Auth. Provider   oseltamivir (TAMIFLU) 75 MG capsule Take 1 capsule (75 mg total) by mouth every 12 (twelve) hours. 10 capsule Romona Murdy C, PA-C   cetirizine-pseudoephedrine (ZYRTEC-D) 5-120 MG tablet Take 1 tablet by mouth daily. 30 tablet Kaycee Haycraft C, PA-C   guaiFENesin-dextromethorphan (ROBITUSSIN DM) 100-10 MG/5ML syrup Take 5 mLs by mouth every 4 (four) hours as needed for cough. 118 mL Tyann Niehaus C, PA-C   ibuprofen (ADVIL,MOTRIN) 600 MG tablet Take 1 tablet (600 mg total) by mouth every 6 (six) hours as needed. 30 tablet Lafaye Mcelmurry, CameronHallie C, PA-C     Controlled Substance Prescriptions [place] Controlled Substance Registry consulted? Not Applicable   Lew DawesWieters, Nkechi Linehan C, [place]PA-C 08/06/18 1110

## 2018-08-23 ENCOUNTER — Telehealth (INDEPENDENT_AMBULATORY_CARE_PROVIDER_SITE_OTHER): Payer: Self-pay | Admitting: Physician Assistant

## 2018-08-23 MED ORDER — NEXIUM 40 MG PO CPDR: 40.0000 mg | DELAYED_RELEASE_CAPSULE | Freq: Every day | ORAL | 0 refills | Status: DC

## 2018-08-23 NOTE — Telephone Encounter (Signed)
I called this in [place]

## 2018-08-23 NOTE — Telephone Encounter (Signed)
Patient is aware that refill has been sent and he has scheduled to see new PCP in February. Maryjean Mornempestt S Deisy Ozbun, CMA

## 2018-08-23 NOTE — Telephone Encounter (Signed)
Patient called to request med refill for NEXIUM 40 MG capsule  Patient has contacted his pharmacy but was advice to request a updated RX.  Please advise [phone]  Patient uses [id number] - [place], [place] - [address]  Thank you Louisa Second

## 2018-08-23 NOTE — Telephone Encounter (Signed)
Pt wants nexium 40mg  daily refilled  Sindy Messingoger Gomez did not prescribe this it was historically reported by the patient  In review of the chart it was listed in the very first encounter in April 2019  I am surmising that the patient was given the Nexium and a variety of other proton pump inhibitors to combat side effects from chronic nonsteroidal use because of his chronic musculoskeletal pain  I have authorized 40 mg Nexium daily dispense 30 with no refills until he can get back in and reestablish with our new provider Gwinda PasseMichelle Edwards in February

## 2018-08-23 NOTE — Telephone Encounter (Signed)
FWD to covering provider. Maryjean Morn, CMA

## 2018-08-28 ENCOUNTER — Other Ambulatory Visit: Payer: Self-pay | Admitting: Family Medicine

## 2018-08-28 DIAGNOSIS — K219 Gastro-esophageal reflux disease without esophagitis: Secondary | ICD-10-CM

## 2018-08-28 MED ORDER — ESOMEPRAZOLE MAGNESIUM 40 MG PO CPDR: 40.0000 mg | DELAYED_RELEASE_CAPSULE | Freq: Every day | ORAL | 11 refills | Status: DC

## 2018-08-28 NOTE — Progress Notes (Signed)
Patient ID: Daniel Kemp, male   DOB: 1961-05-29, 58 y.o.   MRN: [id number]   Patient called to request generic Nexium as his insurance does not cover name brand. RX for generic Nexium sent into patient's pharmacy

## 2018-09-12 ENCOUNTER — Telehealth (INDEPENDENT_AMBULATORY_CARE_PROVIDER_SITE_OTHER): Payer: Self-pay | Admitting: Physician Assistant

## 2018-09-12 NOTE — Telephone Encounter (Signed)
Daniel Kemp called for OV notes regarding authorization form for back brace that was signed off. Please follow up.

## 2018-09-18 ENCOUNTER — Other Ambulatory Visit: Payer: Self-pay

## 2018-09-18 ENCOUNTER — Ambulatory Visit (INDEPENDENT_AMBULATORY_CARE_PROVIDER_SITE_OTHER): Payer: Medicare Other | Admitting: Primary Care

## 2018-09-18 ENCOUNTER — Encounter (INDEPENDENT_AMBULATORY_CARE_PROVIDER_SITE_OTHER): Payer: Self-pay | Admitting: Primary Care

## 2018-09-18 VITALS — BP 119/75 | HR 79 | Temp 97.3°F | Ht 76.0 in | Wt 244.2 lb

## 2018-09-18 DIAGNOSIS — Z760 Encounter for issue of repeat prescription: Secondary | ICD-10-CM | POA: Diagnosis not present

## 2018-09-18 DIAGNOSIS — I10 Essential (primary) hypertension: Secondary | ICD-10-CM

## 2018-09-18 DIAGNOSIS — T7840XS Allergy, unspecified, sequela: Secondary | ICD-10-CM

## 2018-09-18 DIAGNOSIS — E782 Mixed hyperlipidemia: Secondary | ICD-10-CM

## 2018-09-18 DIAGNOSIS — M542 Cervicalgia: Secondary | ICD-10-CM

## 2018-09-18 MED ORDER — ATORVASTATIN CALCIUM 40 MG PO TABS: 40.0000 mg | ORAL_TABLET | Freq: Every day | ORAL | 1 refills | Status: DC

## 2018-09-18 MED ORDER — LISINOPRIL-HYDROCHLOROTHIAZIDE 10-12.5 MG PO TABS: 2.0000 | ORAL_TABLET | Freq: Every day | ORAL | 1 refills | Status: DC

## 2018-09-18 MED ORDER — CETIRIZINE HCL 10 MG PO TABS: 10.0000 mg | ORAL_TABLET | Freq: Every day | ORAL | 5 refills | Status: DC | PRN

## 2018-09-18 MED ORDER — IBUPROFEN 600 MG PO TABS: 600.0000 mg | ORAL_TABLET | Freq: Four times a day (QID) | ORAL | 0 refills | Status: DC | PRN

## 2018-09-18 NOTE — Progress Notes (Signed)
Subjective:  Patient ID: Daniel Kemp, male    DOB: 25-Feb-1961  Age: 58 y.o. MRN: [id number]  CC: Routine follow up   HPI Daniel Kemp presents for follow up for medication refill and fatigue. He is also concerned with pain management testing for allergies than treatment.  MRI ordered and revealed multilevel degenerative change. At L3-L4, there is mild spinal stenosis and moderate left greater than right foraminal narrowing and bilateral lateral recess stenosis.At L4-L5, there is mild spinal stenosis, moderate left greater than right foraminal narrowing, moderately severe left lateral recess stenosis and moderate right lateral recess stenosis. He continues to be managed for pain by pain management clinic.  Outpatient Medications Prior to Visit  Medication Sig Dispense Refill  . atorvastatin (LIPITOR) 40 MG tablet Take 1 tablet (40 mg total) by mouth at bedtime. 90 tablet 3  . cetirizine (ZYRTEC) 10 MG tablet Take 1 tablet (10 mg total) by mouth daily as needed. 30 tablet 5  . cetirizine-pseudoephedrine (ZYRTEC-D) 5-120 MG tablet Take 1 tablet by mouth daily. 30 tablet 0  . cyclobenzaprine (FLEXERIL) 10 MG tablet Take 10 mg by mouth as needed for muscle spasms.    . cyclobenzaprine (FLEXERIL) 5 MG tablet Take 1 tablet by mouth 3 (three) times daily.    Marland Kitchen esomeprazole (NEXIUM) 40 MG capsule Take 1 capsule (40 mg total) by mouth daily at 12 noon. 30 capsule 11  . guaiFENesin-dextromethorphan (ROBITUSSIN DM) 100-10 MG/5ML syrup Take 5 mLs by mouth every 4 (four) hours as needed for cough. 118 mL 0  . HYDROcodone-acetaminophen (NORCO/VICODIN) 5-325 MG per tablet Take 2 tablets by mouth every 8 (eight) hours as needed for pain. 10 tablet 0  . ibuprofen (ADVIL,MOTRIN) 600 MG tablet Take 1 tablet (600 mg total) by mouth every 6 (six) hours as needed. 30 tablet 0  . lisinopril-hydrochlorothiazide (PRINZIDE,ZESTORETIC) 10-12.5 MG tablet Take 2 tablets by mouth daily. 180 tablet 3  . meloxicam  (MOBIC) 15 MG tablet Take 1 tablet (15 mg total) by mouth daily. 90 tablet 0  . oseltamivir (TAMIFLU) 75 MG capsule Take 1 capsule (75 mg total) by mouth every 12 (twelve) hours. 10 capsule 0   No facility-administered medications prior to visit.     ROS Review of Systems  Constitutional: Negative.   HENT: Negative.   Eyes: Negative.   Respiratory: Negative.   Gastrointestinal: Negative.   Endocrine: Negative.   Genitourinary: Negative.   Musculoskeletal: Positive for back pain.  Allergic/Immunologic: Negative.   Neurological: Positive for weakness and numbness.  Hematological: Negative.   Psychiatric/Behavioral: Negative.     Objective:  Ht 6\' 4"  (1.93 m)   Wt 244 lb 3.2 oz (110.8 kg)   BMI 29.72 kg/m   BP/Weight 09/18/2018 08/06/2018 03/30/2018  Systolic BP - 114 133  Diastolic BP - 78 85  Wt. (Lbs) 244.2 - 238.6  BMI 29.72 - 29.04      Physical Exam HENT:     Head: Normocephalic.     Right Ear: Tympanic membrane normal.     Left Ear: Tympanic membrane normal.     Nose: Nose normal.     Mouth/Throat:     Mouth: Mucous membranes are moist.  Eyes:     Extraocular Movements: Extraocular movements intact.     Pupils: Pupils are equal, round, and reactive to light.  Neck:     Musculoskeletal: Neck rigidity present.  Cardiovascular:     Rate and Rhythm: Normal rate and regular rhythm.  Abdominal:  General: Abdomen is flat. Bowel sounds are normal.     Palpations: Abdomen is soft.  Musculoskeletal: Normal range of motion.  Skin:    General: Skin is warm and dry.     Capillary Refill: Capillary refill takes less than 2 seconds.  Neurological:     Mental Status: He is alert and oriented to person, place, and time.  Psychiatric:        Mood and Affect: Mood normal.      Assessment & Plan:   1. Cervicalgia Cervical fusion with limited ROM followed by neurology and pain treated by pain clinic   2. Hypertension, unspecified type BP wnl continue and refill  lisinopril and hctz   3. Medication refill Allergies medication, Zyretec , ibuprofen , questioned about refill on amitriptyline will check refilling   4. Hyperlipidemia  Refilled atorvastatin no problems with cramps or side effects will obtained a FLP    Refilled medication and labs completed at this visit   Follow-up:  6 months   Grayce Sessions NP

## 2018-09-19 LAB — LIPID PANEL
Chol/HDL Ratio: 4.5 ratio (ref 0.0–5.0)
Cholesterol, Total: 166 mg/dL (ref 100–199)
HDL: 37 mg/dL — ABNORMAL LOW (ref >39)
LDL Calculated: 114 mg/dL — ABNORMAL HIGH (ref 0–99)
Triglycerides: 76 mg/dL (ref 0–149)
VLDL Cholesterol Cal: 15 mg/dL (ref 5–40)

## 2018-09-19 LAB — CBC WITH DIFFERENTIAL/PLATELET
Basophils Absolute: 0 10*3/uL (ref 0.0–0.2)
Basos: 1 %
EOS (ABSOLUTE): 0.1 10*3/uL (ref 0.0–0.4)
Eos: 3 %
Hematocrit: 40.9 % (ref 37.5–51.0)
Hemoglobin: 13.6 g/dL (ref 13.0–17.7)
Immature Grans (Abs): 0 10*3/uL (ref 0.0–0.1)
Immature Granulocytes: 0 %
Lymphocytes Absolute: 1.8 10*3/uL (ref 0.7–3.1)
Lymphs: 43 %
MCH: 27.5 pg (ref 26.6–33.0)
MCHC: 33.3 g/dL (ref 31.5–35.7)
MCV: 83 fL (ref 79–97)
Monocytes Absolute: 0.7 10*3/uL (ref 0.1–0.9)
Monocytes: 16 %
Neutrophils Absolute: 1.6 10*3/uL (ref 1.4–7.0)
Neutrophils: 37 %
Platelets: 328 10*3/uL (ref 150–450)
RBC: 4.95 x10E6/uL (ref 4.14–5.80)
RDW: 14.3 % (ref 11.6–15.4)
WBC: 4.2 10*3/uL (ref 3.4–10.8)

## 2018-09-19 LAB — COMPREHENSIVE METABOLIC PANEL
ALT: 18 IU/L (ref 0–44)
AST: 25 IU/L (ref 0–40)
Albumin/Globulin Ratio: 1.4 (ref 1.2–2.2)
Albumin: 4.3 g/dL (ref 3.8–4.9)
Alkaline Phosphatase: 80 IU/L (ref 39–117)
BUN/Creatinine Ratio: 9 (ref 9–20)
BUN: 13 mg/dL (ref 6–24)
Bilirubin Total: 0.4 mg/dL (ref 0.0–1.2)
CO2: 23 mmol/L (ref 20–29)
Calcium: 9.4 mg/dL (ref 8.7–10.2)
Chloride: 99 mmol/L (ref 96–106)
Creatinine, Ser: 1.41 mg/dL — ABNORMAL HIGH (ref 0.76–1.27)
GFR calc Af Amer: 63 mL/min/{1.73_m2} (ref >59)
GFR calc non Af Amer: 55 mL/min/{1.73_m2} — ABNORMAL LOW (ref >59)
Globulin, Total: 3.1 g/dL (ref 1.5–4.5)
Glucose: 99 mg/dL (ref 65–99)
Potassium: 3.7 mmol/L (ref 3.5–5.2)
Sodium: 137 mmol/L (ref 134–144)
Total Protein: 7.4 g/dL (ref 6.0–8.5)

## 2018-09-19 NOTE — Telephone Encounter (Signed)
Done. Daniel Kemp S Daniel Kemp, CMA  

## 2018-10-01 ENCOUNTER — Telehealth (INDEPENDENT_AMBULATORY_CARE_PROVIDER_SITE_OTHER): Payer: Self-pay | Admitting: Primary Care

## 2018-10-01 NOTE — Telephone Encounter (Signed)
Patient called back to check on his lab results. Please call back [phone]

## 2018-10-01 NOTE — Telephone Encounter (Signed)
Results reviewed LDL up 2 points from 10 months ago 114 this is consider Bad chol and can be associated with heart disease .  Elevated creatinine will monitor

## 2018-10-01 NOTE — Telephone Encounter (Signed)
Please access patient chart and review results and advise, so that patient can be notified of results. Labs were drawn on 2/18. Maryjean Morn, CMA

## 2018-10-02 NOTE — Telephone Encounter (Signed)
Left voicemail notifying patient that cholesterol is only up 2 points from 10 months ago. Continue taking atorvastatin. Creatinine elevated, will monitor over time. Maryjean Morn, CMA

## 2018-10-23 ENCOUNTER — Other Ambulatory Visit: Payer: Self-pay | Admitting: Primary Care

## 2018-10-23 MED ORDER — PREGABALIN 75 MG PO CAPS: 75.0000 mg | ORAL_CAPSULE | Freq: Two times a day (BID) | ORAL | 1 refills | Status: DC

## 2018-12-26 ENCOUNTER — Other Ambulatory Visit: Payer: Self-pay | Admitting: Family Medicine

## 2018-12-26 ENCOUNTER — Other Ambulatory Visit: Payer: Self-pay | Admitting: Pharmacist

## 2018-12-26 DIAGNOSIS — R200 Anesthesia of skin: Secondary | ICD-10-CM

## 2018-12-26 DIAGNOSIS — M48061 Spinal stenosis, lumbar region without neurogenic claudication: Secondary | ICD-10-CM

## 2018-12-26 MED ORDER — MELOXICAM 15 MG PO TABS: 15.0000 mg | ORAL_TABLET | Freq: Every day | ORAL | 0 refills | Status: DC

## 2019-01-09 ENCOUNTER — Other Ambulatory Visit (INDEPENDENT_AMBULATORY_CARE_PROVIDER_SITE_OTHER): Payer: Self-pay | Admitting: Primary Care

## 2019-01-09 NOTE — Telephone Encounter (Signed)
Refilled denied last seen in February

## 2019-01-09 NOTE — Telephone Encounter (Signed)
FWD to PCP. Tempestt S Roberts, CMA  

## 2019-01-14 NOTE — Telephone Encounter (Signed)
Sent back to PCP. Will have patient scheduled for OV before august. Nat Christen, [place]

## 2019-01-15 ENCOUNTER — Other Ambulatory Visit (INDEPENDENT_AMBULATORY_CARE_PROVIDER_SITE_OTHER): Payer: Self-pay | Admitting: Primary Care

## 2019-01-15 ENCOUNTER — Other Ambulatory Visit: Payer: Self-pay | Admitting: Pharmacist

## 2019-01-15 MED ORDER — PREGABALIN 75 MG PO CAPS: 75.0000 mg | ORAL_CAPSULE | Freq: Two times a day (BID) | ORAL | 1 refills | Status: DC

## 2019-01-15 NOTE — Telephone Encounter (Signed)
Pt's pharmacy is requesting refill for Lyrica. PMP reviewed; no fills since April, 2020. Will forward to patient's PCP for approval.

## 2019-01-18 NOTE — Telephone Encounter (Signed)
Med refilled several days ago

## 2019-02-22 ENCOUNTER — Other Ambulatory Visit: Payer: Self-pay | Admitting: Family Medicine

## 2019-02-22 ENCOUNTER — Other Ambulatory Visit: Payer: Self-pay | Admitting: Primary Care

## 2019-02-22 DIAGNOSIS — M48061 Spinal stenosis, lumbar region without neurogenic claudication: Secondary | ICD-10-CM

## 2019-02-22 DIAGNOSIS — R200 Anesthesia of skin: Secondary | ICD-10-CM

## 2019-03-22 ENCOUNTER — Other Ambulatory Visit (INDEPENDENT_AMBULATORY_CARE_PROVIDER_SITE_OTHER): Payer: Self-pay | Admitting: Primary Care

## 2019-03-22 ENCOUNTER — Telehealth (INDEPENDENT_AMBULATORY_CARE_PROVIDER_SITE_OTHER): Payer: Self-pay

## 2019-03-22 MED ORDER — LISINOPRIL 20 MG PO TABS: 20.0000 mg | ORAL_TABLET | Freq: Every day | ORAL | 3 refills | Status: DC

## 2019-03-22 MED ORDER — HYDROCHLOROTHIAZIDE 25 MG PO TABS: 25.0000 mg | ORAL_TABLET | Freq: Every day | ORAL | 3 refills | Status: DC

## 2019-03-22 NOTE — Telephone Encounter (Signed)
Sent in lisonpril 20 and HCTZ 25 once daily

## 2019-03-22 NOTE — Telephone Encounter (Signed)
Patient called to request medication refill for lisinopril-hydrochlorothiazide (PRINZIDE,ZESTORETIC) 10-12.5 MG tablet   Patient states that the pharmacy advice that the Rx needed to be split up. He does not understand on what they mean "split up".  Patient uses [facility] [place], [address] AT [address]   Please advice [phone]    Thank you Whitney Post

## 2019-03-22 NOTE — Telephone Encounter (Signed)
Patients current strength is on back order. Please send new Rx separately for both lisinopirl and HCTZ. Spoke with pharmacy they have lisinopril 10 mg tabs on hand and HCTZ 25 mg tabs on hand. Nat Christen, CMA

## 2019-05-23 ENCOUNTER — Other Ambulatory Visit (INDEPENDENT_AMBULATORY_CARE_PROVIDER_SITE_OTHER): Payer: Self-pay

## 2019-05-23 DIAGNOSIS — Z760 Encounter for issue of repeat prescription: Secondary | ICD-10-CM

## 2019-05-23 DIAGNOSIS — E782 Mixed hyperlipidemia: Secondary | ICD-10-CM

## 2019-05-23 MED ORDER — ATORVASTATIN CALCIUM 40 MG PO TABS: 40.0000 mg | ORAL_TABLET | Freq: Every day | ORAL | 0 refills | Status: DC

## 2019-05-30 ENCOUNTER — Other Ambulatory Visit (INDEPENDENT_AMBULATORY_CARE_PROVIDER_SITE_OTHER): Payer: Self-pay | Admitting: Primary Care

## 2019-05-30 MED ORDER — PREGABALIN 75 MG PO CAPS: 75.0000 mg | ORAL_CAPSULE | Freq: Two times a day (BID) | ORAL | 0 refills | Status: DC

## 2019-07-08 ENCOUNTER — Other Ambulatory Visit (INDEPENDENT_AMBULATORY_CARE_PROVIDER_SITE_OTHER): Payer: Self-pay | Admitting: Primary Care

## 2019-07-08 DIAGNOSIS — E782 Mixed hyperlipidemia: Secondary | ICD-10-CM

## 2019-07-08 DIAGNOSIS — Z760 Encounter for issue of repeat prescription: Secondary | ICD-10-CM

## 2019-07-08 NOTE — Telephone Encounter (Signed)
Patient would like 90 day supply

## 2019-07-09 ENCOUNTER — Other Ambulatory Visit (INDEPENDENT_AMBULATORY_CARE_PROVIDER_SITE_OTHER): Payer: Self-pay | Admitting: Primary Care

## 2019-07-09 DIAGNOSIS — Z760 Encounter for issue of repeat prescription: Secondary | ICD-10-CM

## 2019-07-09 DIAGNOSIS — E782 Mixed hyperlipidemia: Secondary | ICD-10-CM

## 2019-07-09 NOTE — Telephone Encounter (Signed)
Needs appointment and blood work

## 2019-07-10 NOTE — Telephone Encounter (Signed)
Was filled on yesterday. Patient would like 90 day supply. Nat Christen, CMA

## 2019-08-21 ENCOUNTER — Other Ambulatory Visit: Payer: Self-pay | Admitting: Family Medicine

## 2019-08-21 DIAGNOSIS — K219 Gastro-esophageal reflux disease without esophagitis: Secondary | ICD-10-CM

## 2019-08-26 ENCOUNTER — Other Ambulatory Visit: Payer: Self-pay

## 2019-08-26 ENCOUNTER — Encounter (INDEPENDENT_AMBULATORY_CARE_PROVIDER_SITE_OTHER): Payer: Self-pay | Admitting: Primary Care

## 2019-08-26 ENCOUNTER — Ambulatory Visit (INDEPENDENT_AMBULATORY_CARE_PROVIDER_SITE_OTHER): Payer: Medicare Other | Admitting: Primary Care

## 2019-08-26 DIAGNOSIS — K219 Gastro-esophageal reflux disease without esophagitis: Secondary | ICD-10-CM | POA: Diagnosis not present

## 2019-08-26 DIAGNOSIS — Z760 Encounter for issue of repeat prescription: Secondary | ICD-10-CM | POA: Diagnosis not present

## 2019-08-26 DIAGNOSIS — E782 Mixed hyperlipidemia: Secondary | ICD-10-CM

## 2019-08-26 DIAGNOSIS — I10 Essential (primary) hypertension: Secondary | ICD-10-CM

## 2019-08-26 DIAGNOSIS — T7840XS Allergy, unspecified, sequela: Secondary | ICD-10-CM

## 2019-08-26 MED ORDER — CETIRIZINE HCL 10 MG PO TABS: 10.0000 mg | ORAL_TABLET | Freq: Every day | ORAL | 5 refills | Status: DC | PRN

## 2019-08-26 MED ORDER — LISINOPRIL 20 MG PO TABS: 20.0000 mg | ORAL_TABLET | Freq: Every day | ORAL | 1 refills | Status: DC

## 2019-08-26 MED ORDER — ATORVASTATIN CALCIUM 40 MG PO TABS: ORAL_TABLET | ORAL | 1 refills | Status: DC

## 2019-08-26 MED ORDER — HYDROCHLOROTHIAZIDE 25 MG PO TABS: 25.0000 mg | ORAL_TABLET | Freq: Every day | ORAL | 1 refills | Status: DC

## 2019-08-26 MED ORDER — ESOMEPRAZOLE MAGNESIUM 40 MG PO CPDR: 40.0000 mg | DELAYED_RELEASE_CAPSULE | Freq: Every day | ORAL | 11 refills | Status: DC

## 2019-08-26 NOTE — Progress Notes (Signed)
Pt has eaten this morning. He had cereal and orange juice

## 2019-08-26 NOTE — Progress Notes (Signed)
Virtual Visit via Telephone Note  I connected with Daniel Kemp on 08/26/19 at  8:30 AM EST by telephone and verified that I am speaking with the correct person using two identifiers.   I discussed the limitations, risks, security and privacy concerns of performing an evaluation and management service by telephone and the availability of in person appointments. I also discussed with the patient that there may be a patient responsible charge related to this service. The patient expressed understanding and agreed to proceed.   History of Present Illness: [name] is having a tele visit for medications refills. He voices no complaints or concerns. Things have been a little different since surgery but adjusting. He will be in this week for fasting labs orders placed. Past Medical History:  Diagnosis Date  . Arm pain, right   . Arthritis   . Back pain with radiation   . Headache(784.0)   . High cholesterol   . Hypertension    Current Outpatient Medications on File Prior to Visit  Medication Sig Dispense Refill  . atorvastatin (LIPITOR) 40 MG tablet TAKE 1 TABLET(40 MG) BY MOUTH AT BEDTIME 90 tablet 1  . cetirizine (ZYRTEC) 10 MG tablet Take 1 tablet (10 mg total) by mouth daily as needed. 30 tablet 5  . cetirizine-pseudoephedrine (ZYRTEC-D) 5-120 MG tablet Take 1 tablet by mouth daily. 30 tablet 0  . cyclobenzaprine (FLEXERIL) 10 MG tablet Take 10 mg by mouth as needed for muscle spasms.    . cyclobenzaprine (FLEXERIL) 5 MG tablet Take 1 tablet by mouth 3 (three) times daily.    Marland Kitchen esomeprazole (NEXIUM) 40 MG capsule Take 1 capsule (40 mg total) by mouth daily at 12 noon. 30 capsule 11  . guaiFENesin-dextromethorphan (ROBITUSSIN DM) 100-10 MG/5ML syrup Take 5 mLs by mouth every 4 (four) hours as needed for cough. 118 mL 0  . hydrochlorothiazide (HYDRODIURIL) 25 MG tablet Take 1 tablet (25 mg total) by mouth daily. 90 tablet 3  . HYDROcodone-acetaminophen (NORCO/VICODIN) 5-325 MG  per tablet Take 2 tablets by mouth every 8 (eight) hours as needed for pain. 10 tablet 0  . ibuprofen (ADVIL,MOTRIN) 600 MG tablet Take 1 tablet (600 mg total) by mouth every 6 (six) hours as needed. 30 tablet 0  . lisinopril (ZESTRIL) 20 MG tablet Take 1 tablet (20 mg total) by mouth daily. 90 tablet 3  . meloxicam (MOBIC) 15 MG tablet TAKE 1 TABLET(15 MG) BY MOUTH DAILY 30 tablet 0  . pregabalin (LYRICA) 75 MG capsule Take 1 capsule (75 mg total) by mouth 2 (two) times daily. 60 capsule 0   No current facility-administered medications on file prior to visit.     Observations/Objective: Review of Systems  All other systems reviewed and are negative.   Assessment and Plan: Daniel Kemp was seen today for medication refill.  Diagnoses and all orders for this visit:  Medication refill -     atorvastatin (LIPITOR) 40 MG tablet; TAKE 1 TABLET(40 MG) BY MOUTH AT BEDTIME -     cetirizine (ZYRTEC) 10 MG tablet; Take 1 tablet (10 mg total) by mouth daily as needed.  Mixed hyperlipidemia Continue atorvastatin 40mg  qhs decrease your fatty foods, red meat, cheese, milk and increase fiber like whole grains and veggies.-      atorvastatin (LIPITOR) 40 MG tablet; TAKE 1 TABLET(40 MG) BY MOUTH AT BEDTIME Future labs lips. cmp and cbc  Gastroesophageal reflux disease Gastroparesis is also called delayed gastric emptying it consist of weak muscular contractions (  peristalsis) of the stomach resulting in food and liquid remaining in the stomach for prolonged periods of time.  Stomach contents does exist more slowly into the duodenum digestive tract.  Symptoms can include nausea vomiting abdominal pain feeling full soon after or beginning to eat, abdominal bloating and heartburn. -     esomeprazole (NEXIUM) 40 MG capsule; Take 1 capsule (40 mg total) by mouth daily at 12 noon.  Allergy, sequela  Use Flonase nasal spray for at least duration of your allergy season.  - For appropriate administration of the  nasal spray, clear the nose, use opposite hand for opposite nare, sniff gently, exhale through your mouth. - For maximal effect take these two nasal sprays at least 30 minutes apart. - Continue  Zyrtec each day, as needed. - Drink at least 64 ounces of water each day. - If you have a humidifier use it nightly. - Remove as many irritants/allergies as you are able to, no pets in the bedroom, change air filters in air vents and carpet  -     cetirizine (ZYRTEC) 10 MG tablet; Take 1 tablet (10 mg total) by mouth daily as needed.  Other orders -     lisinopril (ZESTRIL) 20 MG tablet; Take 1 tablet (20 mg total) by mouth daily. -     hydrochlorothiazide (HYDRODIURIL) 25 MG tablet; Take 1 tablet (25 mg total) by mouth daily.    Follow Up Instructions:    I discussed the assessment and treatment plan with the patient. The patient was provided an opportunity to ask questions and all were answered. The patient agreed with the plan and demonstrated an understanding of the instructions.   The patient was advised to call back or seek an in-person evaluation if the symptoms worsen or if the condition fails to improve as anticipated.  I provided 75minutes of non-face-to-face time during this encounter.   Kerin Perna, NP

## 2019-08-27 ENCOUNTER — Other Ambulatory Visit (INDEPENDENT_AMBULATORY_CARE_PROVIDER_SITE_OTHER): Payer: Medicare Other

## 2019-08-27 ENCOUNTER — Other Ambulatory Visit (INDEPENDENT_AMBULATORY_CARE_PROVIDER_SITE_OTHER): Payer: Self-pay | Admitting: Primary Care

## 2019-08-28 LAB — CBC WITH DIFFERENTIAL/PLATELET
Basophils Absolute: 0 10*3/uL (ref 0.0–0.2)
Basos: 1 %
EOS (ABSOLUTE): 0.2 10*3/uL (ref 0.0–0.4)
Eos: 4 %
Hematocrit: 43.6 % (ref 37.5–51.0)
Hemoglobin: 14.2 g/dL (ref 13.0–17.7)
Immature Grans (Abs): 0 10*3/uL (ref 0.0–0.1)
Immature Granulocytes: 0 %
Lymphocytes Absolute: 2.2 10*3/uL (ref 0.7–3.1)
Lymphs: 45 %
MCH: 27.5 pg (ref 26.6–33.0)
MCHC: 32.6 g/dL (ref 31.5–35.7)
MCV: 84 fL (ref 79–97)
Monocytes Absolute: 0.6 10*3/uL (ref 0.1–0.9)
Monocytes: 14 %
Neutrophils Absolute: 1.7 10*3/uL (ref 1.4–7.0)
Neutrophils: 36 %
Platelets: 304 10*3/uL (ref 150–450)
RBC: 5.17 x10E6/uL (ref 4.14–5.80)
RDW: 14.5 % (ref 11.6–15.4)
WBC: 4.7 10*3/uL (ref 3.4–10.8)

## 2019-08-28 LAB — CMP14+EGFR
ALT: 16 IU/L (ref 0–44)
AST: 21 IU/L (ref 0–40)
Albumin/Globulin Ratio: 1.5 (ref 1.2–2.2)
Albumin: 4.3 g/dL (ref 3.8–4.9)
Alkaline Phosphatase: 87 IU/L (ref 39–117)
BUN/Creatinine Ratio: 11 (ref 9–20)
BUN: 14 mg/dL (ref 6–24)
Bilirubin Total: 0.3 mg/dL (ref 0.0–1.2)
CO2: 23 mmol/L (ref 20–29)
Calcium: 9.5 mg/dL (ref 8.7–10.2)
Chloride: 99 mmol/L (ref 96–106)
Creatinine, Ser: 1.22 mg/dL (ref 0.76–1.27)
GFR calc Af Amer: 75 mL/min/{1.73_m2} (ref >59)
GFR calc non Af Amer: 64 mL/min/{1.73_m2} (ref >59)
Globulin, Total: 2.9 g/dL (ref 1.5–4.5)
Glucose: 82 mg/dL (ref 65–99)
Potassium: 3.8 mmol/L (ref 3.5–5.2)
Sodium: 137 mmol/L (ref 134–144)
Total Protein: 7.2 g/dL (ref 6.0–8.5)

## 2019-08-28 LAB — LIPID PANEL
Chol/HDL Ratio: 4 ratio (ref 0.0–5.0)
Cholesterol, Total: 168 mg/dL (ref 100–199)
HDL: 42 mg/dL (ref >39)
LDL Chol Calc (NIH): 112 mg/dL — ABNORMAL HIGH (ref 0–99)
Triglycerides: 74 mg/dL (ref 0–149)
VLDL Cholesterol Cal: 14 mg/dL (ref 5–40)

## 2019-08-29 ENCOUNTER — Telehealth (INDEPENDENT_AMBULATORY_CARE_PROVIDER_SITE_OTHER): Payer: Self-pay

## 2019-08-29 NOTE — Telephone Encounter (Signed)
Patient is aware that his Your LDL is not in range. Your LDL is the bad cholesterol that can lead to heart attack and stroke. To lower your number you can decrease your fatty foods, red meat, cheese, milk and increase fiber like whole grains and veggies.  continue atorvastatin 40mg  nightly. He verbalized understanding of results. , CMA

## 2019-08-29 NOTE — Telephone Encounter (Signed)
-----   Message from Grayce Sessions, NP sent at 08/29/2019 10:21 AM EST ----- Your LDL is not in range. Your LDL is the bad cholesterol that can lead to heart attack and stroke. To lower your number you can decrease your fatty foods, red meat, cheese, milk and increase fiber like whole grains and veggies.  continue atorvastatin 40mg  nightly

## 2019-11-15 ENCOUNTER — Ambulatory Visit: Payer: Medicare Other | Attending: Internal Medicine

## 2019-11-15 DIAGNOSIS — Z23 Encounter for immunization: Secondary | ICD-10-CM

## 2019-11-15 NOTE — Progress Notes (Signed)
   Covid-19 Vaccination Clinic  Name:  Daniel Kemp    MRN: [id number] DOB: Oct 24, 1960  11/15/2019  [name] was observed post Covid-19 immunization for 15 minutes without incident. He was provided with Vaccine Information Sheet and instruction to access the V-Safe system.   [name] was instructed to call 911 with any severe reactions post vaccine: Marland Kitchen Difficulty breathing  . Swelling of face and throat  . A fast heartbeat  . A bad rash all over body  . Dizziness and weakness   Immunizations Administered    Name Date Dose VIS Date Route   Pfizer COVID-19 Vaccine 11/15/2019 10:39 AM 0.3 mL 07/12/2019 Intramuscular   Manufacturer: [employer], [employer]   Lot: [id number]   NDC: [address]-8

## 2019-11-19 ENCOUNTER — Emergency Department (HOSPITAL_COMMUNITY)
Admission: EM | Admit: 2019-11-19 | Discharge: 2019-11-19 | Disposition: A | Discharge status: Home / Self Care | Payer: No Typology Code available for payment source | Attending: Emergency Medicine | Admitting: Emergency Medicine

## 2019-11-19 ENCOUNTER — Emergency Department (HOSPITAL_COMMUNITY): Payer: No Typology Code available for payment source

## 2019-11-19 ENCOUNTER — Other Ambulatory Visit: Payer: Self-pay

## 2019-11-19 ENCOUNTER — Encounter (HOSPITAL_COMMUNITY): Payer: Self-pay

## 2019-11-19 DIAGNOSIS — Y999 Unspecified external cause status: Secondary | ICD-10-CM | POA: Diagnosis not present

## 2019-11-19 DIAGNOSIS — I10 Essential (primary) hypertension: Secondary | ICD-10-CM | POA: Diagnosis not present

## 2019-11-19 DIAGNOSIS — Y939 Activity, unspecified: Secondary | ICD-10-CM | POA: Diagnosis not present

## 2019-11-19 DIAGNOSIS — Z87891 Personal history of nicotine dependence: Secondary | ICD-10-CM | POA: Diagnosis not present

## 2019-11-19 DIAGNOSIS — Y92410 Unspecified street and highway as the place of occurrence of the external cause: Secondary | ICD-10-CM | POA: Insufficient documentation

## 2019-11-19 DIAGNOSIS — M542 Cervicalgia: Secondary | ICD-10-CM | POA: Diagnosis present

## 2019-11-19 DIAGNOSIS — Z79899 Other long term (current) drug therapy: Secondary | ICD-10-CM | POA: Insufficient documentation

## 2019-11-19 MED ORDER — METHOCARBAMOL 500 MG PO TABS: 500.0000 mg | ORAL_TABLET | Freq: Two times a day (BID) | ORAL | 0 refills | Status: DC

## 2019-11-19 MED ORDER — NAPROXEN 500 MG PO TABS: 500.0000 mg | ORAL_TABLET | Freq: Two times a day (BID) | ORAL | 0 refills | Status: DC

## 2019-11-19 MED ORDER — OXYCODONE-ACETAMINOPHEN 5-325 MG PO TABS
2.0000 | ORAL_TABLET | Freq: Once | ORAL | Status: AC
  Administered 2019-11-19: 2 via ORAL
  Filled 2019-11-19: qty 2

## 2019-11-19 NOTE — Discharge Instructions (Signed)
Take the prescribed medication as directed.  You may be sore/stiff for a bit. You may wish to use heat/ice to help with this as well. Follow-up with your primary care doctor. Return to the ED for new or worsening symptoms.
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
## 2019-11-28 ENCOUNTER — Ambulatory Visit (INDEPENDENT_AMBULATORY_CARE_PROVIDER_SITE_OTHER): Payer: Medicare Other | Admitting: Primary Care

## 2019-11-28 ENCOUNTER — Encounter (INDEPENDENT_AMBULATORY_CARE_PROVIDER_SITE_OTHER): Payer: Self-pay | Admitting: Primary Care

## 2019-11-28 ENCOUNTER — Other Ambulatory Visit: Payer: Self-pay

## 2019-11-28 DIAGNOSIS — M549 Dorsalgia, unspecified: Secondary | ICD-10-CM | POA: Diagnosis not present

## 2019-11-28 DIAGNOSIS — M542 Cervicalgia: Secondary | ICD-10-CM

## 2019-11-28 DIAGNOSIS — I10 Essential (primary) hypertension: Secondary | ICD-10-CM

## 2019-11-28 DIAGNOSIS — Z760 Encounter for issue of repeat prescription: Secondary | ICD-10-CM

## 2019-11-28 DIAGNOSIS — T7840XS Allergy, unspecified, sequela: Secondary | ICD-10-CM

## 2019-11-28 DIAGNOSIS — M5489 Other dorsalgia: Secondary | ICD-10-CM

## 2019-11-28 MED ORDER — CETIRIZINE HCL 10 MG PO TABS: 10.0000 mg | ORAL_TABLET | Freq: Every day | ORAL | 5 refills | Status: DC | PRN

## 2019-11-28 MED ORDER — CYCLOBENZAPRINE HCL 10 MG PO TABS: 10.0000 mg | ORAL_TABLET | Freq: Three times a day (TID) | ORAL | 0 refills | Status: DC

## 2019-11-28 MED ORDER — NAPROXEN 500 MG PO TABS: 500.0000 mg | ORAL_TABLET | Freq: Two times a day (BID) | ORAL | 1 refills | Status: DC

## 2019-11-28 MED ORDER — HYDROCHLOROTHIAZIDE 25 MG PO TABS: 25.0000 mg | ORAL_TABLET | Freq: Every day | ORAL | 1 refills | Status: DC
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
## 2019-12-10 ENCOUNTER — Ambulatory Visit: Payer: Medicare Other | Attending: Internal Medicine

## 2019-12-10 DIAGNOSIS — Z23 Encounter for immunization: Secondary | ICD-10-CM

## 2019-12-10 NOTE — Progress Notes (Signed)
   Covid-19 Vaccination Clinic  Name:  SHLOIME KEILMAN    MRN: [id number] DOB: 22-Nov-1960  12/10/2019  Mr. Coppa was observed post Covid-19 immunization for 15 minutes without incident. He was provided with Vaccine Information Sheet and instruction to access the V-Safe system.   Mr. Recker was instructed to call 911 with any severe reactions post vaccine: Marland Kitchen Difficulty breathing  . Swelling of face and throat  . A fast heartbeat  . A bad rash all over body  . Dizziness and weakness   Immunizations Administered    Name Date Dose VIS Date Route   Pfizer COVID-19 Vaccine 12/10/2019 10:30 AM 0.3 mL 09/25/2018 Intramuscular   Manufacturer: [employer], [employer]   Lot: [id number]   NDC: [address]-7

## 2020-01-03 IMAGING — DX DG HIP (WITH OR WITHOUT PELVIS) 2-3V*L*
3 series · 3 of 3 positions shown · non-contrast
Comparison: None.

CLINICAL DATA: Chronic left hip pain.

EXAM:
DG HIP (WITH OR WITHOUT PELVIS) 2-3V LEFT

[pelvis ap]
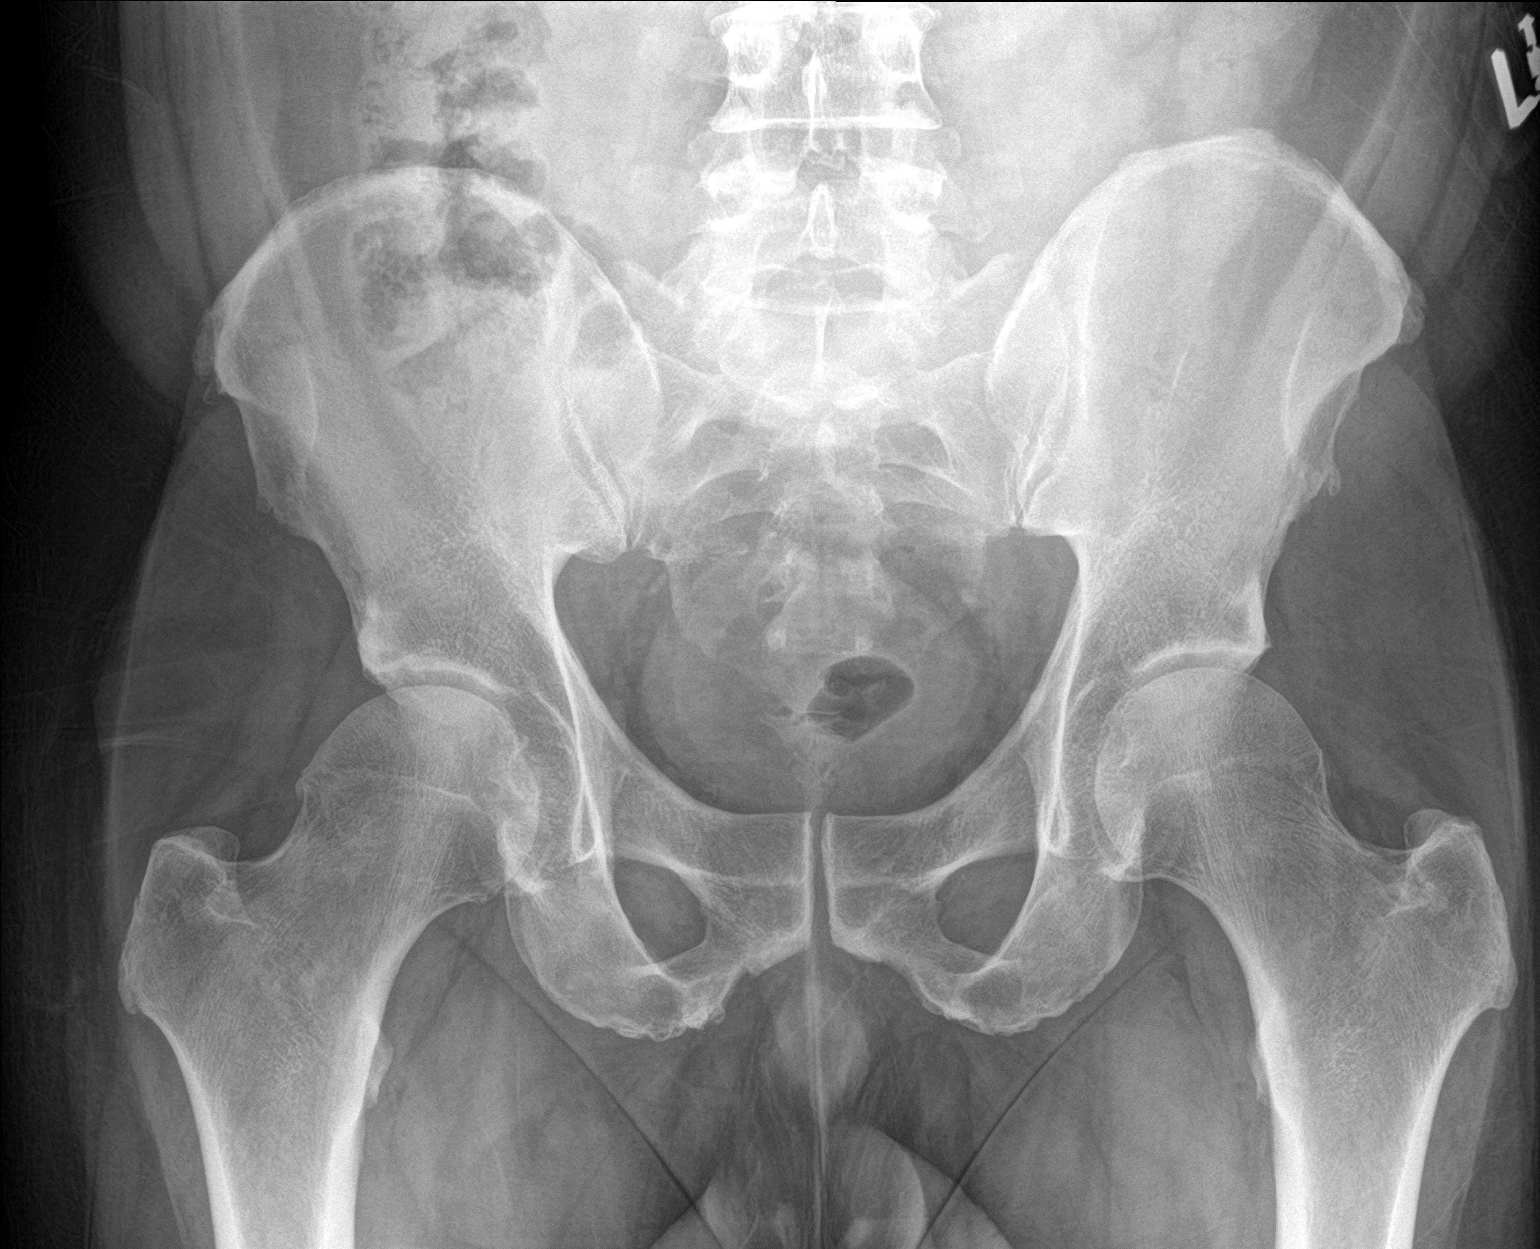

[hip ap]
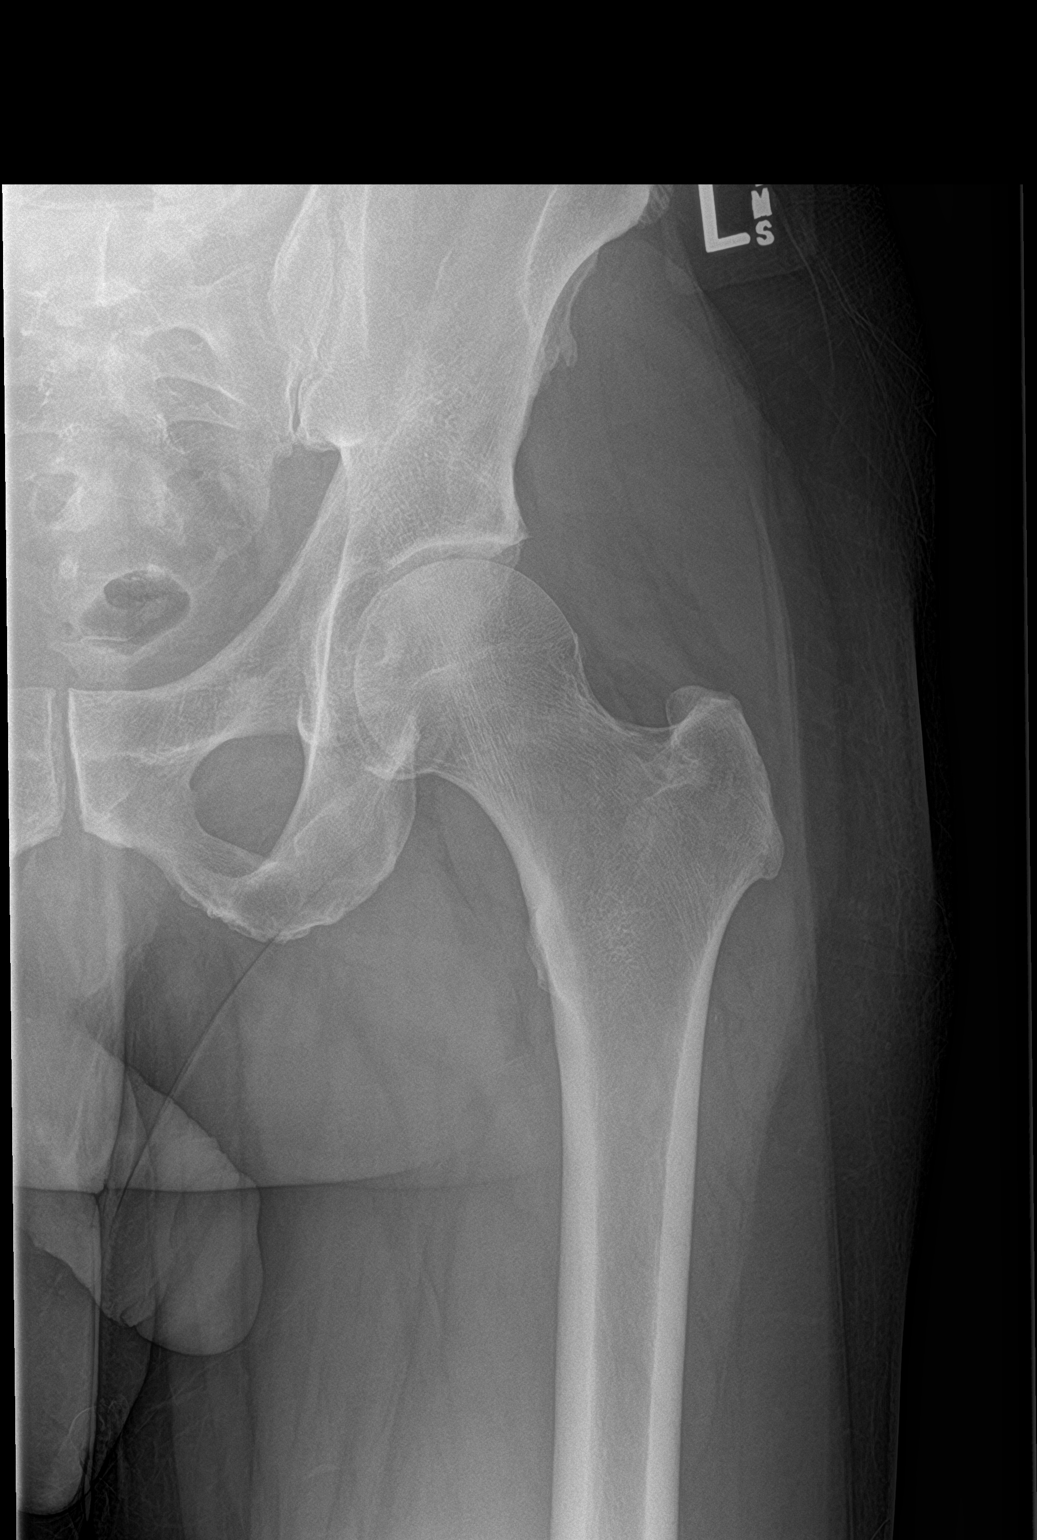

[hip lat]
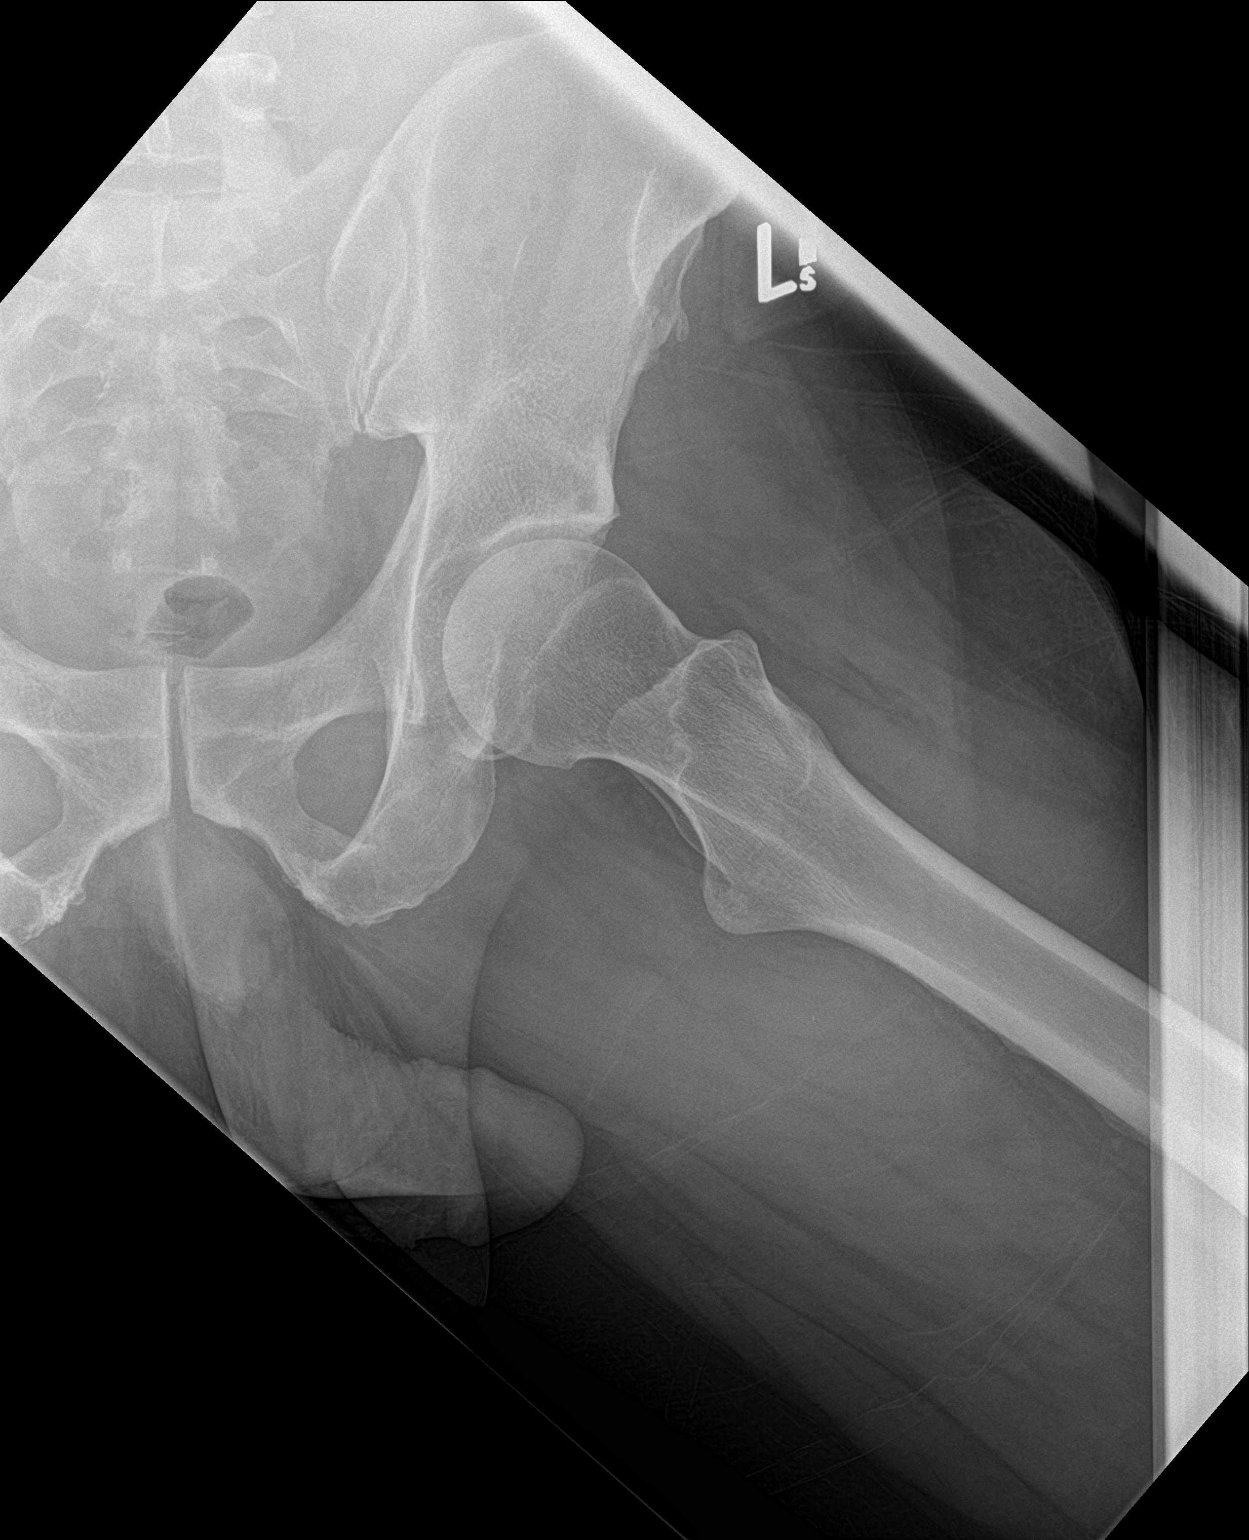

[3 of 3 positions shown; findings below may reference images not displayed]

FINDINGS: There is no evidence of hip fracture or dislocation. There is no
evidence of arthropathy or other focal bone abnormality.
IMPRESSION: Normal left hip.

## 2020-01-03 IMAGING — DX DG LUMBAR SPINE COMPLETE 4+V
5 series · 5 of 5 positions shown · non-contrast
Comparison: No prior.

CLINICAL DATA: Low back pain.  Left hip pain.

EXAM:
LUMBAR SPINE - COMPLETE 4+ VIEW

[l-spine ap]
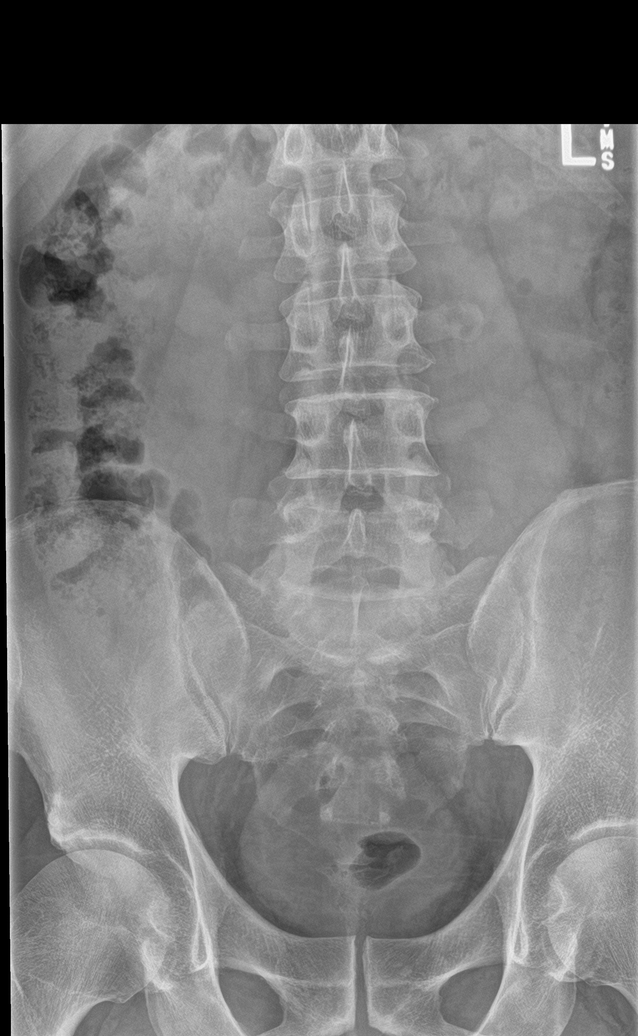

[l-spine obl (1 of 2)]
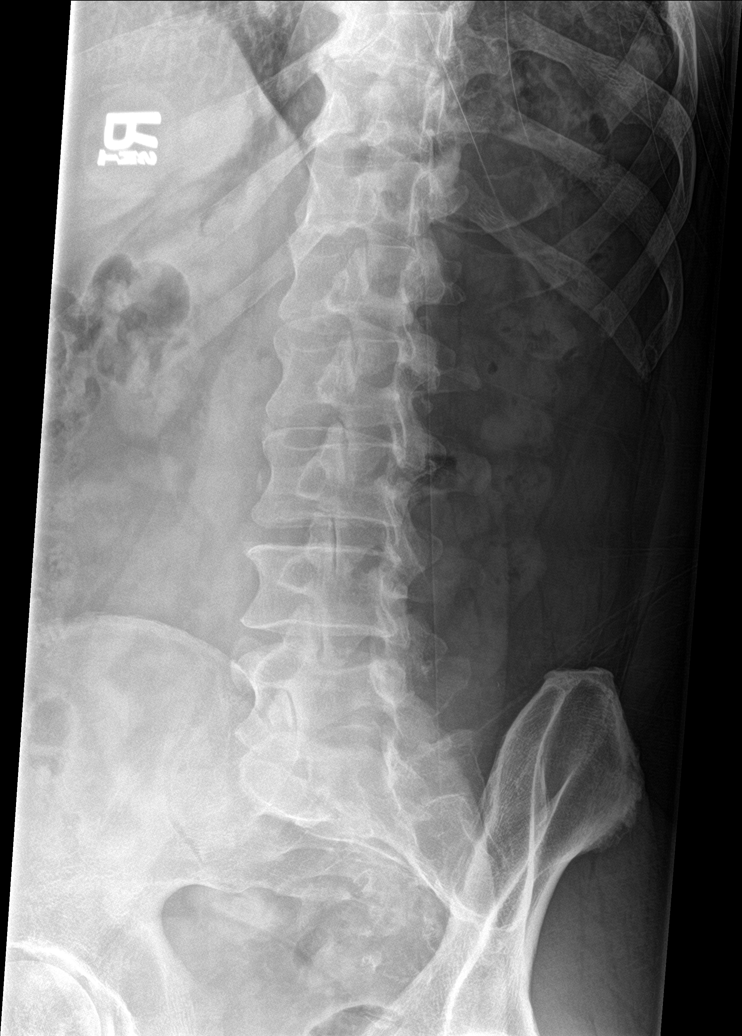

[l-spine obl (2 of 2)]
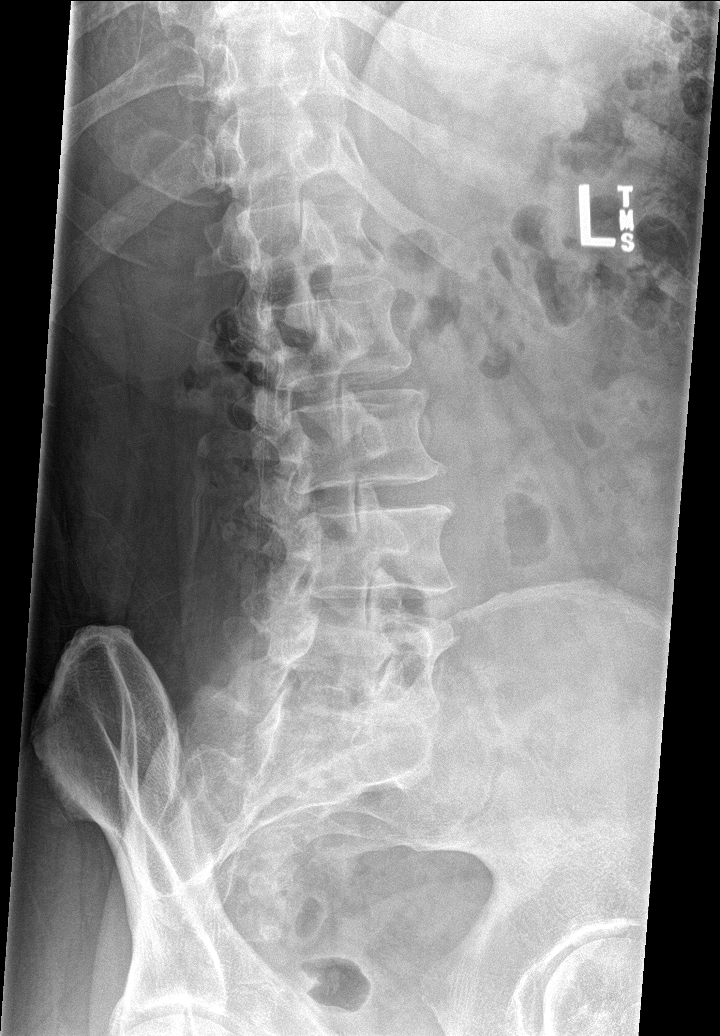

[l-spine lat]
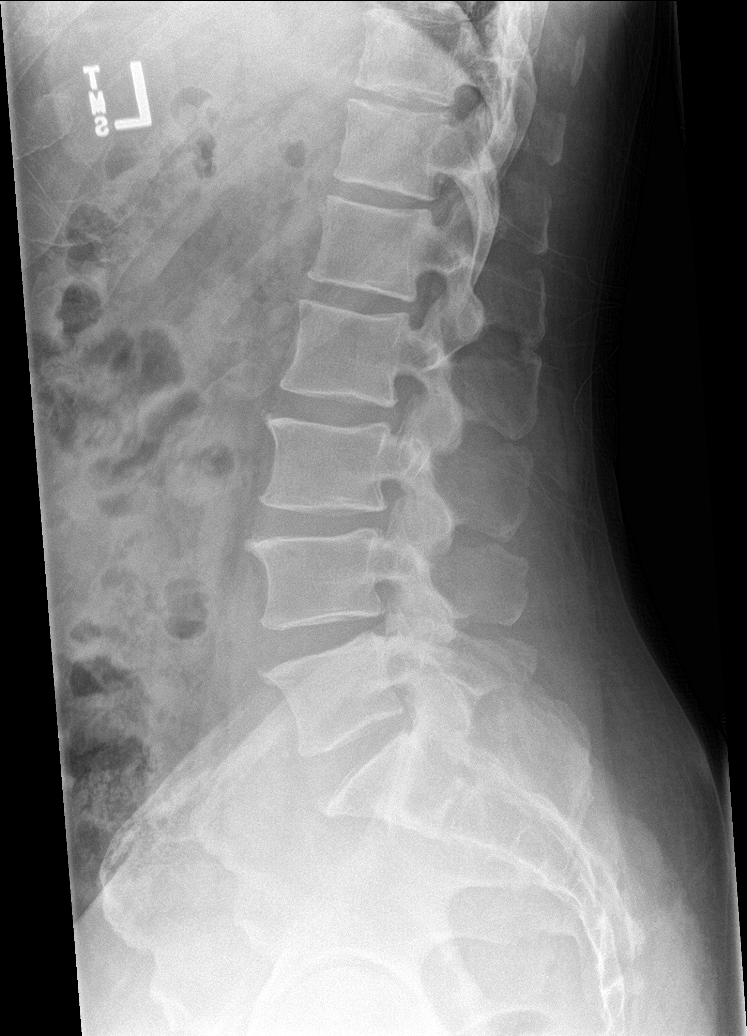

[l-spine spot]
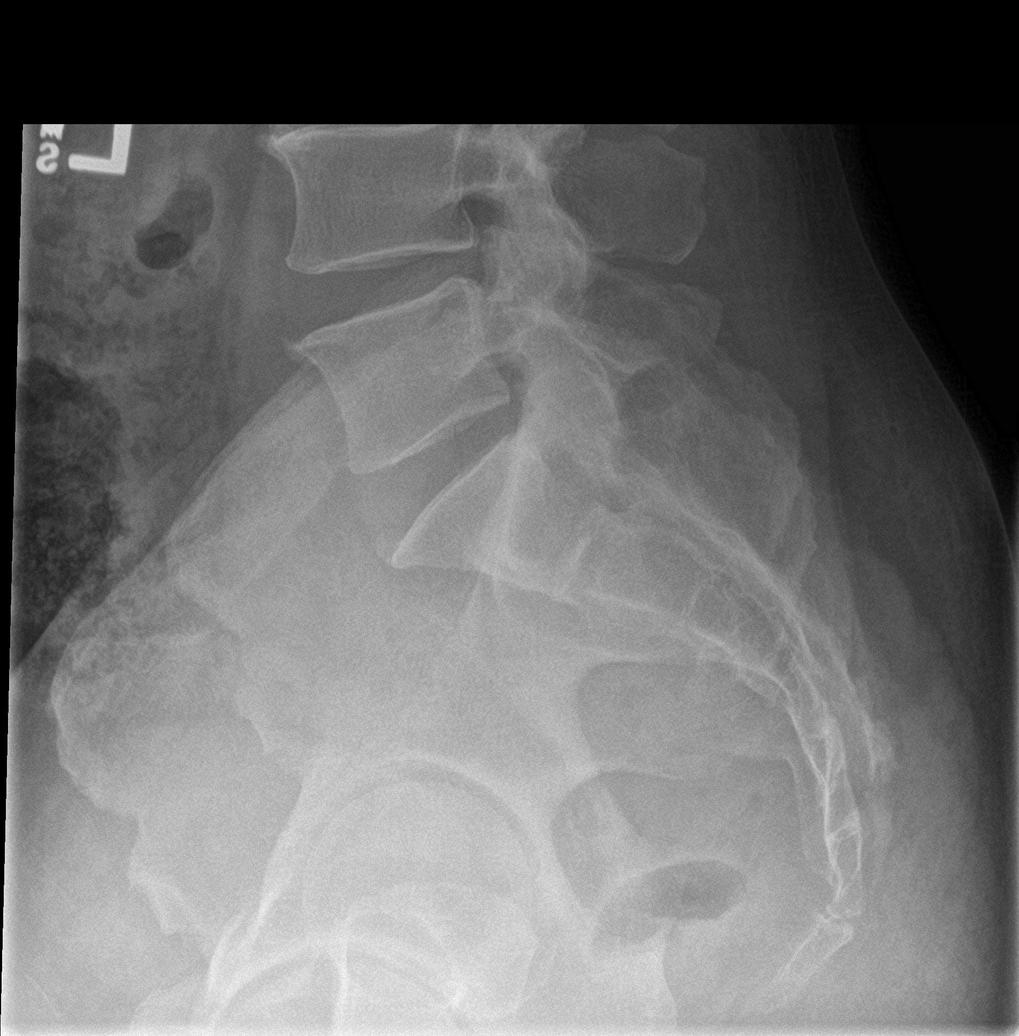

[5 of 5 positions shown; findings below may reference images not displayed]

FINDINGS: No acute bony abnormality identified. No evidence of fracture or
dislocation. Normal alignment mineralization. Diffuse multilevel
degenerative change.
IMPRESSION: Diffuse multilevel degenerative change. No acute or focal
abnormality.

## 2020-01-07 ENCOUNTER — Other Ambulatory Visit (INDEPENDENT_AMBULATORY_CARE_PROVIDER_SITE_OTHER): Payer: Self-pay | Admitting: Primary Care

## 2020-01-07 NOTE — Telephone Encounter (Signed)
Sent to PCP ?

## 2020-04-03 ENCOUNTER — Other Ambulatory Visit (INDEPENDENT_AMBULATORY_CARE_PROVIDER_SITE_OTHER): Payer: Self-pay | Admitting: Primary Care
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
## 2020-04-26 ENCOUNTER — Other Ambulatory Visit (INDEPENDENT_AMBULATORY_CARE_PROVIDER_SITE_OTHER): Payer: Self-pay | Admitting: Primary Care

## 2020-04-26 DIAGNOSIS — E782 Mixed hyperlipidemia: Secondary | ICD-10-CM

## 2020-04-26 DIAGNOSIS — Z760 Encounter for issue of repeat prescription: Secondary | ICD-10-CM
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
## 2020-05-14 ENCOUNTER — Telehealth (INDEPENDENT_AMBULATORY_CARE_PROVIDER_SITE_OTHER): Payer: Self-pay | Admitting: Primary Care

## 2020-05-14 NOTE — Telephone Encounter (Signed)
Patient would like a call from the nurse regarding a payment that has to be made to [facility].  Patient stated that the insurance told him that the doctor needs to request a conditional payment form to the insurance.  Please call patient so that he can explain further at [phone]

## 2020-05-18 NOTE — Telephone Encounter (Signed)
Patient advised to contact billing at [phone].

## 2020-07-09 ENCOUNTER — Other Ambulatory Visit (INDEPENDENT_AMBULATORY_CARE_PROVIDER_SITE_OTHER): Payer: Self-pay | Admitting: Primary Care

## 2020-08-11 ENCOUNTER — Other Ambulatory Visit (INDEPENDENT_AMBULATORY_CARE_PROVIDER_SITE_OTHER): Payer: Self-pay | Admitting: Primary Care

## 2020-08-12 ENCOUNTER — Other Ambulatory Visit (INDEPENDENT_AMBULATORY_CARE_PROVIDER_SITE_OTHER): Payer: Self-pay | Admitting: Primary Care

## 2020-09-10 ENCOUNTER — Other Ambulatory Visit (INDEPENDENT_AMBULATORY_CARE_PROVIDER_SITE_OTHER): Payer: Self-pay | Admitting: Primary Care

## 2020-09-10 NOTE — Telephone Encounter (Signed)
Sent to PCP to refill  
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
## 2020-09-27 ENCOUNTER — Other Ambulatory Visit (INDEPENDENT_AMBULATORY_CARE_PROVIDER_SITE_OTHER): Payer: Self-pay | Admitting: Primary Care

## 2020-09-28 ENCOUNTER — Other Ambulatory Visit: Payer: Self-pay

## 2020-09-28 ENCOUNTER — Encounter (INDEPENDENT_AMBULATORY_CARE_PROVIDER_SITE_OTHER): Payer: Self-pay | Admitting: Primary Care

## 2020-09-28 ENCOUNTER — Ambulatory Visit (INDEPENDENT_AMBULATORY_CARE_PROVIDER_SITE_OTHER): Payer: Medicare Other | Admitting: Primary Care

## 2020-09-28 VITALS — BP 115/80 | HR 71 | Temp 97.5°F | Ht 76.0 in | Wt 237.6 lb

## 2020-09-28 DIAGNOSIS — E782 Mixed hyperlipidemia: Secondary | ICD-10-CM

## 2020-09-28 DIAGNOSIS — I10 Essential (primary) hypertension: Secondary | ICD-10-CM

## 2020-09-28 DIAGNOSIS — Z760 Encounter for issue of repeat prescription: Secondary | ICD-10-CM | POA: Diagnosis not present

## 2020-09-28 MED ORDER — LISINOPRIL 20 MG PO TABS
ORAL_TABLET | ORAL | 1 refills | Status: DC
Start: 2020-09-28 — End: 2021-03-31

## 2020-09-28 MED ORDER — HYDROCHLOROTHIAZIDE 25 MG PO TABS: ORAL_TABLET | ORAL | 1 refills | Status: DC

## 2020-09-28 NOTE — Progress Notes (Signed)
Established Patient Office Visit  Subjective:  Patient ID: Daniel Kemp, male    DOB: Aug 05, 1960  Age: 60 y.o. MRN: [id number]  CC:  Chief Complaint  Patient presents with  . Blood Pressure Check  . Medication Refill    HPI Daniel Kemp is 60 year old who presents for management of hypertension Denies shortness of breath, headaches, chest pain or lower extremity edema. Bp unremarkable.   Past Medical History:  Diagnosis Date  . Arm pain, right   . Arthritis   . Back pain with radiation   . Headache(784.0)   . High cholesterol   . Hypertension     Past Surgical History:  Procedure Laterality Date  . ANTERIOR CERVICAL DECOMP/DISCECTOMY FUSION  07/28/2011   Procedure: ANTERIOR CERVICAL DECOMPRESSION/DISCECTOMY FUSION 2 LEVELS;  Surgeon: [facility];  Location: [place];  Service: Orthopedics;  Laterality: N/A;  ACDF C6-7 and C7-T1  . HAND ARTHROPLASTY      Family History  Problem Relation Age of Onset  . Stomach cancer Mother   . Prostate cancer Father     Social History   Socioeconomic History  . Marital status: Married    Spouse name: Not on file  . Number of children: 4  . Years of education: Web designer   . Highest education level: Some college, no degree  Occupational History  . Not on file  Tobacco Use  . Smoking status: Former Smoker    Packs/day: 0.50    Types: Cigarettes    Quit date: 01/02/2013    Years since quitting: 7.7  . Smokeless tobacco: Never Used  Vaping Use  . Vaping Use: Never used  Substance and Sexual Activity  . Alcohol use: Yes    Comment: occasional beer  . Drug use: No    Comment: prescription hydrocodone  . Sexual activity: Not on file  Other Topics Concern  . Not on file  Social History Narrative   Lives at home with wife & son who is 73 y.o.   Left handed   Drinks 1 cup of caffeine daily   Social Determinants of Health   Financial Resource Strain: Not on file  Food Insecurity: Not on file   Transportation Needs: Not on file  Physical Activity: Not on file  Stress: Not on file  Social Connections: Not on file  Intimate Partner Violence: Not on file    Outpatient Medications Prior to Visit  Medication Sig Dispense Refill  . atorvastatin (LIPITOR) 40 MG tablet TAKE 1 TABLET(40 MG) BY MOUTH AT BEDTIME 90 tablet 1  . cetirizine (ZYRTEC) 10 MG tablet Take 1 tablet (10 mg total) by mouth daily as needed. 30 tablet 5  . hydrochlorothiazide (HYDRODIURIL) 25 MG tablet TAKE 1 TABLET(25 MG) BY MOUTH DAILY 18 tablet 0  . lisinopril (ZESTRIL) 20 MG tablet TAKE 1 TABLET(20 MG) BY MOUTH DAILY 90 tablet 1  . cyclobenzaprine (FLEXERIL) 10 MG tablet Take 1 tablet (10 mg total) by mouth 3 (three) times daily. 90 tablet 0  . naproxen (NAPROSYN) 500 MG tablet Take 1 tablet (500 mg total) by mouth 2 (two) times daily with a meal. 60 tablet 1  . pregabalin (LYRICA) 75 MG capsule Take 1 capsule (75 mg total) by mouth 2 (two) times daily. 60 capsule 0   No facility-administered medications prior to visit.    Allergies  Allergen Reactions  . Fish-Derived Products Swelling  . Iodinated Diagnostic Agents Other (See Comments)  . Peanut-Containing Drug Products Swelling  ROS Review of Systems Pertinent positive and negative noted in HPI    Objective:    Physical Exam Vitals reviewed.  Constitutional:      Appearance: Normal appearance.  HENT:     Head: Normocephalic.     Right Ear: External ear normal.     Left Ear: External ear normal.     Nose: Nose normal.  Eyes:     Extraocular Movements: Extraocular movements intact.  Cardiovascular:     Rate and Rhythm: Normal rate and regular rhythm.  Pulmonary:     Effort: Pulmonary effort is normal.     Breath sounds: Normal breath sounds.  Abdominal:     General: Bowel sounds are normal. There is distension.     Palpations: Abdomen is soft.  Musculoskeletal:        General: Normal range of motion.     Cervical back: Normal range of  motion.  Skin:    General: Skin is warm and dry.  Neurological:     Mental Status: He is alert and oriented to person, place, and time.  Psychiatric:        Mood and Affect: Mood normal.        Behavior: Behavior normal.        Thought Content: Thought content normal.        Judgment: Judgment normal.     BP 115/80 (BP Location: Right Arm, Patient Position: Sitting, Cuff Size: Large)   Pulse 71   Temp (!) 97.5 F (36.4 C) (Temporal)   Ht '6\' 4"'  (1.93 m)   Wt 237 lb 9.6 oz (107.8 kg)   SpO2 96%   BMI 28.92 kg/m  Wt Readings from Last 3 Encounters:  09/28/20 237 lb 9.6 oz (107.8 kg)  11/28/19 228 lb (103.4 kg)  11/19/19 230 lb (104.3 kg)     Health Maintenance Due  Topic Date Due  . COVID-19 Vaccine (3 - Booster for Pfizer series) 06/11/2020    There are no preventive care reminders to display for this patient.  No results found for: TSH Lab Results  Component Value Date   WBC 4.7 08/27/2019   HGB 14.2 08/27/2019   HCT 43.6 08/27/2019   MCV 84 08/27/2019   PLT 304 08/27/2019   Lab Results  Component Value Date   NA 137 08/27/2019   K 3.8 08/27/2019   CO2 23 08/27/2019   GLUCOSE 82 08/27/2019   BUN 14 08/27/2019   CREATININE 1.22 08/27/2019   BILITOT 0.3 08/27/2019   ALKPHOS 87 08/27/2019   AST 21 08/27/2019   ALT 16 08/27/2019   PROT 7.2 08/27/2019   ALBUMIN 4.3 08/27/2019   CALCIUM 9.5 08/27/2019   Lab Results  Component Value Date   CHOL 168 08/27/2019   Lab Results  Component Value Date   HDL 42 08/27/2019   Lab Results  Component Value Date   LDLCALC 112 (H) 08/27/2019   Lab Results  Component Value Date   TRIG 74 08/27/2019   Lab Results  Component Value Date   CHOLHDL 4.0 08/27/2019   No results found for: HGBA1C    Assessment & Plan:  Daniel Kemp was seen today for blood pressure check and medication refill.  Diagnoses and all orders for this visit:  Essential hypertension Well controlled on HCTZ 48m and Lisinopril 251m goal  of less than 130/80, met continue  low-sodium, DASH diet, medication compliance, 150 minutes of moderate intensity exercise per week.   CMP14+EGFR  Medication refill  lisinopril (  ZESTRIL) 20 MG tablet; TAKE 1 TABLET(20 MG) BY MOUTH DAILY -     hydrochlorothiazide (HYDRODIURIL) 25 MG tablet; TAKE 1 TABLET(25 MG) BY MOUTH DAILY  Mixed hyperlipidemia On atorvastatin 59m has enough without refills. Continue to decrease your fatty foods, red meat, cheese, milk and increase fiber like whole grains and veggies.  -     Lipid panel; Future   Follow-up: Return in about 6 months (around 03/28/2021) for BP/labs .    MKerin Perna NP

## 2020-09-29 ENCOUNTER — Other Ambulatory Visit (INDEPENDENT_AMBULATORY_CARE_PROVIDER_SITE_OTHER): Payer: Medicare Other

## 2020-09-29 DIAGNOSIS — E782 Mixed hyperlipidemia: Secondary | ICD-10-CM

## 2020-09-30 LAB — LIPID PANEL
Chol/HDL Ratio: 4 ratio (ref 0.0–5.0)
Cholesterol, Total: 163 mg/dL (ref 100–199)
HDL: 41 mg/dL (ref >39)
LDL Chol Calc (NIH): 108 mg/dL — ABNORMAL HIGH (ref 0–99)
Triglycerides: 74 mg/dL (ref 0–149)
VLDL Cholesterol Cal: 14 mg/dL (ref 5–40)

## 2020-10-02 ENCOUNTER — Telehealth (INDEPENDENT_AMBULATORY_CARE_PROVIDER_SITE_OTHER): Payer: Self-pay

## 2020-10-02 NOTE — Telephone Encounter (Signed)
Patient is aware of results per PCP. Brode Sculley S Wash Nienhaus, CMA  

## 2020-10-02 NOTE — Telephone Encounter (Signed)
-----   Message from Grayce Sessions, NP sent at 10/02/2020  8:36 AM EST ----- LDL has improved. Decrease red meat, fatty food, cheese and milk. Increase fiber with whole grains and veggies.

## 2020-10-28 ENCOUNTER — Other Ambulatory Visit (INDEPENDENT_AMBULATORY_CARE_PROVIDER_SITE_OTHER): Payer: Self-pay | Admitting: Primary Care

## 2020-10-28 DIAGNOSIS — Z760 Encounter for issue of repeat prescription: Secondary | ICD-10-CM

## 2020-10-28 DIAGNOSIS — K219 Gastro-esophageal reflux disease without esophagitis: Secondary | ICD-10-CM

## 2020-10-28 DIAGNOSIS — E782 Mixed hyperlipidemia: Secondary | ICD-10-CM

## 2020-10-28 NOTE — Telephone Encounter (Signed)
Requested medications are due for refill today.  unknown  Requested medications are on the active medications list.  no  Last refill. unknown  Future visit scheduled.   yes  Notes to clinic. Medication was d/c 11/28/2019

## 2020-11-06 ENCOUNTER — Other Ambulatory Visit (INDEPENDENT_AMBULATORY_CARE_PROVIDER_SITE_OTHER): Payer: Self-pay | Admitting: Primary Care

## 2020-11-06 DIAGNOSIS — K219 Gastro-esophageal reflux disease without esophagitis: Secondary | ICD-10-CM

## 2020-11-06 NOTE — Telephone Encounter (Signed)
Sent to PCP to refill if appropriate.  

## 2020-11-13 ENCOUNTER — Telehealth (INDEPENDENT_AMBULATORY_CARE_PROVIDER_SITE_OTHER): Payer: Self-pay | Admitting: Primary Care

## 2020-11-13 DIAGNOSIS — K219 Gastro-esophageal reflux disease without esophagitis: Secondary | ICD-10-CM

## 2020-11-16 ENCOUNTER — Other Ambulatory Visit (INDEPENDENT_AMBULATORY_CARE_PROVIDER_SITE_OTHER): Payer: Self-pay | Admitting: Primary Care

## 2020-11-16 DIAGNOSIS — K219 Gastro-esophageal reflux disease without esophagitis: Secondary | ICD-10-CM

## 2020-11-16 MED ORDER — ESOMEPRAZOLE MAGNESIUM 40 MG PO CPDR: DELAYED_RELEASE_CAPSULE | ORAL | 3 refills | Status: DC

## 2020-11-26 ENCOUNTER — Telehealth (INDEPENDENT_AMBULATORY_CARE_PROVIDER_SITE_OTHER): Payer: Self-pay

## 2020-11-26 ENCOUNTER — Other Ambulatory Visit (INDEPENDENT_AMBULATORY_CARE_PROVIDER_SITE_OTHER): Payer: Self-pay | Admitting: Primary Care

## 2020-11-26 DIAGNOSIS — K219 Gastro-esophageal reflux disease without esophagitis: Secondary | ICD-10-CM

## 2020-11-26 MED ORDER — OMEPRAZOLE 20 MG PO CPDR: 20.0000 mg | DELAYED_RELEASE_CAPSULE | Freq: Every day | ORAL | 3 refills | Status: DC

## 2020-11-26 NOTE — Telephone Encounter (Signed)
Pts wife called stating that the pt is out of this medication. She states that the pharmacy does not have this on file. Please advise.

## 2020-11-26 NOTE — Telephone Encounter (Signed)
Patient needs Rx for omeprazole because insurance is no longer covering esomeprazole. Maryjean Morn, CMA    Copied from CRM [id number]. Topic: General - Other >> Nov 25, 2020  9:16 AM Gaetana Michaelis A wrote: Reason for CRM: Patient would like to be contacted regarding their prescriptions  Patient has attempted to previously contact the practice to coordinate their medications with their insurers  Please contact to further advise when possible

## 2021-02-26 ENCOUNTER — Other Ambulatory Visit (INDEPENDENT_AMBULATORY_CARE_PROVIDER_SITE_OTHER): Payer: Self-pay | Admitting: Primary Care

## 2021-02-26 DIAGNOSIS — K219 Gastro-esophageal reflux disease without esophagitis: Secondary | ICD-10-CM

## 2021-03-28 ENCOUNTER — Other Ambulatory Visit (INDEPENDENT_AMBULATORY_CARE_PROVIDER_SITE_OTHER): Payer: Self-pay | Admitting: Primary Care

## 2021-03-28 DIAGNOSIS — I10 Essential (primary) hypertension: Secondary | ICD-10-CM
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
## 2021-03-29 ENCOUNTER — Ambulatory Visit (INDEPENDENT_AMBULATORY_CARE_PROVIDER_SITE_OTHER): Payer: Medicare Other | Admitting: Primary Care

## 2021-03-29 NOTE — Telephone Encounter (Signed)
Sent to PCP to refill  

## 2021-03-31 ENCOUNTER — Encounter (INDEPENDENT_AMBULATORY_CARE_PROVIDER_SITE_OTHER): Payer: Self-pay | Admitting: Primary Care

## 2021-03-31 ENCOUNTER — Other Ambulatory Visit: Payer: Self-pay

## 2021-03-31 ENCOUNTER — Ambulatory Visit (INDEPENDENT_AMBULATORY_CARE_PROVIDER_SITE_OTHER): Payer: Medicare Other | Admitting: Primary Care

## 2021-03-31 VITALS — BP 120/82 | HR 68 | Temp 97.5°F | Ht 76.0 in | Wt 232.8 lb

## 2021-03-31 DIAGNOSIS — E782 Mixed hyperlipidemia: Secondary | ICD-10-CM | POA: Diagnosis not present

## 2021-03-31 DIAGNOSIS — J302 Other seasonal allergic rhinitis: Secondary | ICD-10-CM

## 2021-03-31 DIAGNOSIS — T7840XS Allergy, unspecified, sequela: Secondary | ICD-10-CM

## 2021-03-31 DIAGNOSIS — R351 Nocturia: Secondary | ICD-10-CM | POA: Diagnosis not present

## 2021-03-31 DIAGNOSIS — I10 Essential (primary) hypertension: Secondary | ICD-10-CM | POA: Diagnosis not present

## 2021-03-31 DIAGNOSIS — K219 Gastro-esophageal reflux disease without esophagitis: Secondary | ICD-10-CM

## 2021-03-31 DIAGNOSIS — Z760 Encounter for issue of repeat prescription: Secondary | ICD-10-CM

## 2021-03-31 MED ORDER — HYDROCHLOROTHIAZIDE 25 MG PO TABS: ORAL_TABLET | ORAL | 1 refills | Status: DC

## 2021-03-31 MED ORDER — LISINOPRIL 20 MG PO TABS: ORAL_TABLET | ORAL | 1 refills | Status: DC

## 2021-03-31 MED ORDER — OMEPRAZOLE 20 MG PO CPDR: DELAYED_RELEASE_CAPSULE | ORAL | 0 refills | Status: DC

## 2021-03-31 MED ORDER — CETIRIZINE HCL 10 MG PO TABS: 10.0000 mg | ORAL_TABLET | Freq: Every day | ORAL | 5 refills | Status: DC | PRN

## 2021-03-31 NOTE — Progress Notes (Signed)
[place]   Daniel Kemp is a 60 y.o. male presents for hypertension evaluation, Denies shortness of breath, headaches, chest pain or lower extremity edema, sudden onset, vision changes, unilateral weakness, dizziness, paresthesias   Patient reports adherence with medications.  Dietary habits include: low sodium diet  Exercise habits include:walking , yard work  Family / Social history: No Past Medical History:  Diagnosis Date   Arm pain, right    Arthritis    Back pain with radiation    Headache(784.0)    High cholesterol    Hypertension    Past Surgical History:  Procedure Laterality Date   ANTERIOR CERVICAL DECOMP/DISCECTOMY FUSION  07/28/2011   Procedure: ANTERIOR CERVICAL DECOMPRESSION/DISCECTOMY FUSION 2 LEVELS;  Surgeon: Lawrence Santiago [facility];  Location: [place];  Service: Orthopedics;  Laterality: N/A;  ACDF C6-7 and C7-T1   HAND ARTHROPLASTY     Allergies  Allergen Reactions   Fish-Derived Products Swelling   Iodinated Diagnostic Agents Other (See Comments)   Peanut-Containing Drug Products Swelling   Current Outpatient Medications on File Prior to Visit  Medication Sig Dispense Refill   atorvastatin (LIPITOR) 40 MG tablet TAKE 1 TABLET(40 MG) BY MOUTH AT BEDTIME 90 tablet 1   cetirizine (ZYRTEC) 10 MG tablet Take 1 tablet (10 mg total) by mouth daily as needed. 30 tablet 5   hydrochlorothiazide (HYDRODIURIL) 25 MG tablet TAKE 1 TABLET(25 MG) BY MOUTH DAILY 90 tablet 1   lisinopril (ZESTRIL) 20 MG tablet TAKE 1 TABLET(20 MG) BY MOUTH DAILY 90 tablet 1   omeprazole (PRILOSEC) 20 MG capsule TAKE 1 CAPSULE(20 MG) BY MOUTH DAILY 30 capsule 3   No current facility-administered medications on file prior to visit.   Social History   Socioeconomic History   Marital status: Married    Spouse name: Not on file   Number of children: 4   Years of education: Web designer    Highest education level: Some college, no degree   Occupational History   Not on file  Tobacco Use   Smoking status: Former    Packs/day: 0.50    Types: Cigarettes    Quit date: 01/02/2013    Years since quitting: 8.2   Smokeless tobacco: Never  Vaping Use   Vaping Use: Never used  Substance and Sexual Activity   Alcohol use: Yes    Comment: occasional beer   Drug use: No    Comment: prescription hydrocodone   Sexual activity: Not on file  Other Topics Concern   Not on file  Social History Narrative   Lives at home with wife & son who is 10 y.o.   Left handed   Drinks 1 cup of caffeine daily   Social Determinants of Health   Financial Resource Strain: Not on file  Food Insecurity: Not on file  Transportation Needs: Not on file  Physical Activity: Not on file  Stress: Not on file  Social Connections: Not on file  Intimate Partner Violence: Not on file   Family History  Problem Relation Age of Onset   Stomach cancer Mother    Prostate cancer Father      OBJECTIVE:  Vitals:   03/31/21 0950  BP: 120/82  Pulse: 68  Temp: (!) 97.5 F (36.4 C)  TempSrc: Temporal  SpO2: 98%  Weight: 232 lb 12.8 oz (105.6 kg)  Height: '6\' 4"'  (1.93 m)    Physical Exam General: Vital signs reviewed.  Patient is well-developed and well-nourished, overweight male (obese with commodities) in  no acute distress and cooperative with exam.  Head: Normocephalic and atraumatic. Eyes: EOMI, conjunctivae normal, no scleral icterus.  Neck: Supple, trachea midline, normal ROM, no JVD, masses, thyromegaly, or carotid bruit present.  Cardiovascular: RRR, S1 normal, S2 normal, no murmurs, gallops, or rubs. Pulmonary/Chest: Clear to auscultation bilaterally, no wheezes, rales, or rhonchi. Abdominal: Soft, non-tender, non-distended, BS +, no masses, organomegaly, or guarding present.  Musculoskeletal: No joint deformities, erythema, or stiffness, ROM full and nontender. Extremities: No lower extremity edema bilaterally,  pulses symmetric and intact  bilaterally. No cyanosis or clubbing. Neurological: A&O x3, Strength is normal and symmetric bilaterally, cranial nerve II-XII are grossly intact, no focal motor deficit, sensory intact to light touch bilaterally.  Skin: Warm, dry and intact. No rashes or erythema. Psychiatric: Normal mood and affect. speech and behavior is normal. Cognition and memory are normal.    Review of Systems  Musculoskeletal:  Positive for back pain and neck pain.  Psychiatric/Behavioral:  The patient has insomnia.        Due to pain in neck and back and unable to find a comfortable position to sleep  All other systems reviewed and are negative.  Last 3 Office BP readings: BP Readings from Last 3 Encounters:  03/31/21 120/82  09/28/20 115/80  11/28/19 (!) 142/76    BMET    Component Value Date/Time   NA 137 08/27/2019 0840   K 3.8 08/27/2019 0840   CL 99 08/27/2019 0840   CO2 23 08/27/2019 0840   GLUCOSE 82 08/27/2019 0840   GLUCOSE 89 10/22/2012 0733   BUN 14 08/27/2019 0840   CREATININE 1.22 08/27/2019 0840   CALCIUM 9.5 08/27/2019 0840   GFRNONAA 64 08/27/2019 0840   GFRAA 75 08/27/2019 0840    Renal function: CrCl cannot be calculated (Patient's most recent lab result is older than the maximum 21 days allowed.).  Clinical ASCVD: Yes  The 10-year ASCVD risk score Mikey Bussing DC Jr., et al., 2013) is: 12.4%   Values used to calculate the score:     Age: 60 years     Sex: Male     Is Non-Hispanic African American: Yes     Diabetic: No     Tobacco smoker: No     Systolic Blood Pressure: 878 mmHg     Is BP treated: Yes     HDL Cholesterol: 41 mg/dL     Total Cholesterol: 163 mg/dL  ASCVD risk factors include- [place]   ASSESSMENT & PLAN: Caster was seen today for hypertension and medication refill.  Diagnoses and all orders for this visit:  Medication refill -     cetirizine (ZYRTEC) 10 MG tablet; Take 1 tablet (10 mg total) by mouth daily as needed.  Mixed hyperlipidemia  Healthy  lifestyle diet of fruits vegetables fish nuts whole grains and low saturated fat . Foods high in cholesterol or liver, fatty meats,cheese, butter avocados, nuts and seeds, chocolate and fried foods. -     Lipid panel; Future -     CBC with Differential/Platelet; Future -     CMP14+EGFR; Future -     hydrochlorothiazide (HYDRODIURIL) 25 MG tablet; TAKE 1 TABLET(25 MG) BY MOUTH DAILY -     lisinopril (ZESTRIL) 20 MG tablet; TAKE 1 TABLET(20 MG) BY MOUTH DAILY  Nocturia -     PSA; Future -     CBC with Differential/Platelet; Future  Allergy, sequela -     cetirizine (ZYRTEC) 10 MG tablet; Take 1 tablet (10 mg total) by  mouth daily as needed.  Gastroesophageal reflux disease, unspecified whether esophagitis present Discussed chronic use of PPI information on AVS -     omeprazole (PRILOSEC) 20 MG capsule; Take  1 tablet daily as needed for heartburn/indigestion   Essential hypertension -Counseled on lifestyle modifications for blood pressure control including reduced dietary sodium, increased exercise, weight reduction and adequate sleep. Also, educated patient about the risk for cardiovascular events, stroke and heart attack. Also counseled patient about the importance of medication adherence. If you participate in smoking, it is important to stop using tobacco as this will increase the risks associated with uncontrolled blood pressure.   -Hypertension longstanding diagnosed currently HCTZ 64m and Lisinopril 20 mg daily on current medications. Patient is adherent with current medications.   Goal BP:  For patients younger than 60: Goal BP < 130/80. For patients 60 and older: Goal BP < 140/90. For patients with diabetes: Goal BP < 130/80. Your most recent BP: 120/82  Minimize salt intake. Minimize alcohol intake   This note has been created with DSurveyor, quantity Any transcriptional errors are unintentional.   MKerin Perna  NP 03/31/2021, 10:17 AM

## 2021-03-31 NOTE — Patient Instructions (Signed)
Although PPIs (omeprazole )have had an encouraging safety profile, recent studies regarding the long-term use of PPI medications have noted potential adverse effects, including risk of fractures, pneumonia, Clostridium difficile diarrhea, hypomagnesemia, vitamin B12 deficiency, chronic kidney disease, and dementia.

## 2021-04-01 ENCOUNTER — Other Ambulatory Visit (INDEPENDENT_AMBULATORY_CARE_PROVIDER_SITE_OTHER): Payer: Medicare Other

## 2021-04-01 DIAGNOSIS — R351 Nocturia: Secondary | ICD-10-CM

## 2021-04-01 DIAGNOSIS — I10 Essential (primary) hypertension: Secondary | ICD-10-CM

## 2021-04-01 DIAGNOSIS — E782 Mixed hyperlipidemia: Secondary | ICD-10-CM

## 2021-04-02 LAB — CMP14+EGFR
ALT: 15 IU/L (ref 0–44)
AST: 22 IU/L (ref 0–40)
Albumin/Globulin Ratio: 1.7 (ref 1.2–2.2)
Albumin: 4.3 g/dL (ref 3.8–4.8)
Alkaline Phosphatase: 85 IU/L (ref 44–121)
BUN/Creatinine Ratio: 14 (ref 10–24)
BUN: 17 mg/dL (ref 8–27)
Bilirubin Total: 0.3 mg/dL (ref 0.0–1.2)
CO2: 23 mmol/L (ref 20–29)
Calcium: 9.4 mg/dL (ref 8.6–10.2)
Chloride: 104 mmol/L (ref 96–106)
Creatinine, Ser: 1.22 mg/dL (ref 0.76–1.27)
Globulin, Total: 2.5 g/dL (ref 1.5–4.5)
Glucose: 96 mg/dL (ref 65–99)
Potassium: 4.5 mmol/L (ref 3.5–5.2)
Sodium: 140 mmol/L (ref 134–144)
Total Protein: 6.8 g/dL (ref 6.0–8.5)
eGFR: 67 mL/min/{1.73_m2} (ref >59)

## 2021-04-02 LAB — CBC WITH DIFFERENTIAL/PLATELET
Basophils Absolute: 0 10*3/uL (ref 0.0–0.2)
Basos: 1 %
EOS (ABSOLUTE): 0.2 10*3/uL (ref 0.0–0.4)
Eos: 3 %
Hematocrit: 41.4 % (ref 37.5–51.0)
Hemoglobin: 14 g/dL (ref 13.0–17.7)
Immature Grans (Abs): 0 10*3/uL (ref 0.0–0.1)
Immature Granulocytes: 0 %
Lymphocytes Absolute: 1.7 10*3/uL (ref 0.7–3.1)
Lymphs: 34 %
MCH: 28 pg (ref 26.6–33.0)
MCHC: 33.8 g/dL (ref 31.5–35.7)
MCV: 83 fL (ref 79–97)
Monocytes Absolute: 0.8 10*3/uL (ref 0.1–0.9)
Monocytes: 15 %
Neutrophils Absolute: 2.4 10*3/uL (ref 1.4–7.0)
Neutrophils: 47 %
Platelets: 294 10*3/uL (ref 150–450)
RBC: 5 x10E6/uL (ref 4.14–5.80)
RDW: 13.4 % (ref 11.6–15.4)
WBC: 5 10*3/uL (ref 3.4–10.8)

## 2021-04-02 LAB — LIPID PANEL
Chol/HDL Ratio: 4.3 ratio (ref 0.0–5.0)
Cholesterol, Total: 180 mg/dL (ref 100–199)
HDL: 42 mg/dL (ref >39)
LDL Chol Calc (NIH): 121 mg/dL — ABNORMAL HIGH (ref 0–99)
Triglycerides: 94 mg/dL (ref 0–149)
VLDL Cholesterol Cal: 17 mg/dL (ref 5–40)

## 2021-04-02 LAB — PSA: Prostate Specific Ag, Serum: 1.8 ng/mL (ref 0.0–4.0)

## 2021-04-05 ENCOUNTER — Other Ambulatory Visit (INDEPENDENT_AMBULATORY_CARE_PROVIDER_SITE_OTHER): Payer: Self-pay | Admitting: Primary Care

## 2021-04-05 DIAGNOSIS — E782 Mixed hyperlipidemia: Secondary | ICD-10-CM

## 2021-04-05 DIAGNOSIS — Z760 Encounter for issue of repeat prescription: Secondary | ICD-10-CM

## 2021-04-05 MED ORDER — ATORVASTATIN CALCIUM 40 MG PO TABS: ORAL_TABLET | ORAL | 1 refills | Status: DC

## 2021-04-06 ENCOUNTER — Telehealth (INDEPENDENT_AMBULATORY_CARE_PROVIDER_SITE_OTHER): Payer: Self-pay

## 2021-04-06 NOTE — Telephone Encounter (Signed)
-----   Message from Grayce Sessions, NP sent at 04/04/2021  8:36 PM EDT ----- Labs are normal except LDL elevated continue atorvastatin 40mg  at bed time. Monitor your fried foods, red meat, cheese, fatty foods. PSA normal

## 2021-04-06 NOTE — Telephone Encounter (Signed)
Patient is aware of normal labs except elevated cholesterol. Advised to continue taking atorvastatin at bed time and monitor intake of fried food, fatty food,milk and cheese. Maryjean Morn, CMA

## 2021-04-07 ENCOUNTER — Other Ambulatory Visit (INDEPENDENT_AMBULATORY_CARE_PROVIDER_SITE_OTHER): Payer: Self-pay | Admitting: Primary Care

## 2021-04-07 DIAGNOSIS — K219 Gastro-esophageal reflux disease without esophagitis: Secondary | ICD-10-CM

## 2021-04-30 ENCOUNTER — Ambulatory Visit (INDEPENDENT_AMBULATORY_CARE_PROVIDER_SITE_OTHER): Payer: Medicare Other

## 2021-04-30 DIAGNOSIS — Z0000 Encounter for general adult medical examination without abnormal findings: Secondary | ICD-10-CM

## 2021-04-30 NOTE — Progress Notes (Signed)
Subjective:   Daniel Kemp is a 60 y.o. male who presents for an Initial Medicare Annual Wellness Visit. I connected with  Daniel Kemp on 04/30/21 by an audio only telemedicine application and verified that I am speaking with the correct person using two identifiers.   I discussed the limitations, risks, security and privacy concerns of performing an evaluation and management service by telephone and the availability of in person appointments. I also discussed with the patient that there may be a patient responsible charge related to this service. The patient expressed understanding and verbally consented to this telephonic visit.  Location of Patient: Home Location of Provider: Office  List any persons and their role that are participating in the visit with the patient.    Review of Systems    Defer to PCP       Objective:    There were no vitals filed for this visit. There is no height or weight on file to calculate BMI.  Advanced Directives 07/27/2011  Does Patient Have a Medical Advance Directive? Patient does not have advance directive;Patient would like information  Would patient like information on creating a medical advance directive? Advance directive packet given  Pre-existing out of facility DNR order (yellow form or pink MOST form) No    Current Medications (verified) Outpatient Encounter Medications as of 04/30/2021  Medication Sig   atorvastatin (LIPITOR) 40 MG tablet TAKE 1 TABLET(40 MG) BY MOUTH AT BEDTIME   cetirizine (ZYRTEC) 10 MG tablet Take 1 tablet (10 mg total) by mouth daily as needed.   hydrochlorothiazide (HYDRODIURIL) 25 MG tablet TAKE 1 TABLET(25 MG) BY MOUTH DAILY   lisinopril (ZESTRIL) 20 MG tablet TAKE 1 TABLET(20 MG) BY MOUTH DAILY   omeprazole (PRILOSEC) 20 MG capsule Take  1 tablet daily as needed for heartburn/indigestion   No facility-administered encounter medications on file as of 04/30/2021.    Allergies (verified) Fish-derived  products, Iodinated diagnostic agents, and Peanut-containing drug products   History: Past Medical History:  Diagnosis Date   Arm pain, right    Arthritis    Back pain with radiation    Headache(784.0)    High cholesterol    Hypertension    Past Surgical History:  Procedure Laterality Date   ANTERIOR CERVICAL DECOMP/DISCECTOMY FUSION  07/28/2011   Procedure: ANTERIOR CERVICAL DECOMPRESSION/DISCECTOMY FUSION 2 LEVELS;  Surgeon: Jeffie Pollock [facility];  Location: [facility] OR;  Service: Orthopedics;  Laterality: N/A;  ACDF C6-7 and C7-T1   HAND ARTHROPLASTY     Family History  Problem Relation Age of Onset   Stomach cancer Mother    Prostate cancer Father    Social History   Socioeconomic History   Marital status: Married    Spouse name: Not on file   Number of children: 4   Years of education: Environmental health practitioner    Highest education level: Some college, no degree  Occupational History   Not on file  Tobacco Use   Smoking status: Former    Packs/day: 0.50    Types: Cigarettes    Quit date: 01/02/2013    Years since quitting: 8.3   Smokeless tobacco: Never  Vaping Use   Vaping Use: Never used  Substance and Sexual Activity   Alcohol use: Yes    Comment: occasional beer   Drug use: No    Comment: prescription hydrocodone   Sexual activity: Not on file  Other Topics Concern   Not on file  Social History Narrative   Lives at  home with wife & son who is 25 y.o.   Left handed   Drinks 1 cup of caffeine daily   Social Determinants of Health   Financial Resource Strain: Not on file  Food Insecurity: Not on file  Transportation Needs: Not on file  Physical Activity: Not on file  Stress: Not on file  Social Connections: Not on file    Tobacco Counseling Counseling given: Not Answered   Clinical Intake:                 Diabetic?No         Activities of Daily Living No flowsheet data found.  Patient Care Team: Grayce Sessions, NP as PCP -  General (Internal Medicine)  Indicate any recent Medical Services you may have received from other than [facility] providers in the past year (date may be approximate).     Assessment:   This is a routine wellness examination for Daniel Kemp.  Hearing/Vision screen No results found.  Dietary issues and exercise activities discussed:     Goals Addressed   None   Depression Screen PHQ 2/9 Scores 03/31/2021 09/28/2020 11/28/2019 08/26/2019 09/18/2018 03/30/2018 02/02/2018  PHQ - 2 Score 0 0 0 0 0 0 0    Fall Risk Fall Risk  03/31/2021 09/28/2020 11/28/2019 08/26/2019 09/18/2018  Falls in the past year? 0 0 0 0 0    FALL RISK PREVENTION PERTAINING TO THE HOME:  Any stairs in or around the home? No  If so, are there any without handrails?  N/a Home free of loose throw rugs in walkways, pet beds, electrical cords, etc? Yes  Adequate lighting in your home to reduce risk of falls? Yes   ASSISTIVE DEVICES UTILIZED TO PREVENT FALLS:  Life alert? No  Use of a cane, walker or w/c? No  Grab bars in the bathroom? Yes  Shower chair or bench in shower? No  Elevated toilet seat or a handicapped toilet? No   TIMED UP AND GO:  Was the test performed? No .  Length of time to ambulate 10 feet: n/a sec.    Cognitive Function:        Immunizations Immunization History  Administered Date(s) Administered   PFIZER(Purple Top)SARS-COV-2 Vaccination 11/15/2019, 12/10/2019   Pneumococcal Polysaccharide-23 07/29/2011   Tdap 03/30/2018    TDAP status: Up to date  Flu Vaccine status: Declined, Education has been provided regarding the importance of this vaccine but patient still declined. Advised may receive this vaccine at local pharmacy or Health Dept. Aware to provide a copy of the vaccination record if obtained from local pharmacy or Health Dept. Verbalized acceptance and understanding.  Pneumococcal vaccine status: Up to date  Covid-19 vaccine status: Completed vaccines  Qualifies for Shingles  Vaccine? Yes   Zostavax completed No   Shingrix Completed?: No.    Education has been provided regarding the importance of this vaccine. Patient has been advised to call insurance company to determine out of pocket expense if they have not yet received this vaccine. Advised may also receive vaccine at local pharmacy or Health Dept. Verbalized acceptance and understanding.  Screening Tests Health Maintenance  Topic Date Due   Zoster Vaccines- Shingrix (1 of 2) Never done   COVID-19 Vaccine (3 - Booster for Pfizer series) 05/11/2020   INFLUENZA VACCINE  03/01/2021   COLONOSCOPY (Pts 45-56yrs Insurance coverage will need to be confirmed)  09/30/2026   TETANUS/TDAP  03/30/2028   Hepatitis C Screening  Completed   HIV Screening  Completed  HPV VACCINES  Aged Out    Health Maintenance  Health Maintenance Due  Topic Date Due   Zoster Vaccines- Shingrix (1 of 2) Never done   COVID-19 Vaccine (3 - Booster for Pfizer series) 05/11/2020   INFLUENZA VACCINE  03/01/2021    Colorectal cancer screening: Type of screening: Colonoscopy. Completed 09/29/2016. Repeat every 10 years  Lung Cancer Screening: (Low Dose CT Chest recommended if Age 74-80 years, 30 pack-year currently smoking OR have quit w/in 15years.) does qualify.   Lung Cancer Screening Referral: declined  Additional Screening:  Hepatitis C Screening: does not qualify; Completed n/a  Vision Screening: Recommended annual ophthalmology exams for early detection of glaucoma and other disorders of the eye. Is the patient up to date with their annual eye exam?  Yes  Who is the provider or what is the name of the office in which the patient attends annual eye exams? [facility] If pt is not established with a provider, would they like to be referred to a provider to establish care? No .   Dental Screening: Recommended annual dental exams for proper oral hygiene  Community Resource Referral / Chronic Care Management: CRR required  this visit?  No   CCM required this visit?  No      Plan:     I have personally reviewed and noted the following in the patient's chart:   Medical and social history Use of alcohol, tobacco or illicit drugs  Current medications and supplements including opioid prescriptions. Patient is currently taking opioid prescriptions. Information provided to patient regarding non-opioid alternatives. Patient advised to discuss non-opioid treatment plan with their provider. Functional ability and status Nutritional status Physical activity Advanced directives List of other physicians Hospitalizations, surgeries, and ER visits in previous 12 months Vitals Screenings to include cognitive, depression, and falls Referrals and appointments  In addition, I have reviewed and discussed with patient certain preventive protocols, quality metrics, and best practice recommendations. A written personalized care plan for preventive services as well as general preventive health recommendations were provided to patient.     Maxie Barb, CMA   04/30/2021   Nurse Notes: Non face to face time 20 minutes.   Mr. Tritz , Thank you for taking time to come for your Medicare Wellness Visit. I appreciate your ongoing commitment to your health goals. Please review the following plan we discussed and let me know if I can assist you in the future.   These are the goals we discussed:  Goals   None     This is a list of the screening recommended for you and due dates:  Health Maintenance  Topic Date Due   Zoster (Shingles) Vaccine (1 of 2) Never done   COVID-19 Vaccine (3 - Booster for Pfizer series) 05/11/2020   Flu Shot  03/01/2021   Colon Cancer Screening  09/30/2026   Tetanus Vaccine  03/30/2028   Hepatitis C Screening: USPSTF Recommendation to screen - Ages 18-79 yo.  Completed   HIV Screening  Completed   HPV Vaccine  Aged Out

## 2021-05-13 ENCOUNTER — Ambulatory Visit (HOSPITAL_COMMUNITY)
Admission: EM | Admit: 2021-05-13 | Discharge: 2021-05-13 | Disposition: A | Discharge status: Home / Self Care | Payer: Medicare Other | Attending: Emergency Medicine | Admitting: Emergency Medicine

## 2021-05-13 ENCOUNTER — Encounter (HOSPITAL_COMMUNITY): Payer: Self-pay | Admitting: Emergency Medicine

## 2021-05-13 ENCOUNTER — Other Ambulatory Visit (HOSPITAL_COMMUNITY): Payer: Self-pay

## 2021-05-13 ENCOUNTER — Other Ambulatory Visit: Payer: Self-pay

## 2021-05-13 DIAGNOSIS — J0140 Acute pansinusitis, unspecified: Secondary | ICD-10-CM

## 2021-05-13 MED ORDER — AMOXICILLIN-POT CLAVULANATE 875-125 MG PO TABS
1.0000 | ORAL_TABLET | Freq: Two times a day (BID) | ORAL | 0 refills | Status: DC
  Filled 2021-05-13: qty 14, 7d supply, fill #0

## 2021-05-13 MED ORDER — FLUTICASONE PROPIONATE 50 MCG/ACT NA SUSP
2.0000 | Freq: Every day | NASAL | 0 refills | Status: DC
Start: 2021-05-13 — End: 2022-01-12
  Filled 2021-05-13: qty 16, 30d supply, fill #0

## 2021-05-13 NOTE — ED Triage Notes (Signed)
Patient reports onset 3 weeks ago.  Nasal congestion, drainage will not go away, frequently clears throat.  Patient mentions a smell, smells like cold.  Patient has been using mucinex, robitussin.

## 2021-05-13 NOTE — ED Provider Notes (Signed)
HPI  SUBJECTIVE:  Daniel Kemp is a 60 y.o. male who presents with 3 weeks of left-sided nasal congestion that has become foul-smelling.  He states that he can "smell the cold".  He is clearing his throat frequently due to the postnasal drip.  States that he spits up yellowish-green material and has an occasional cough.  He had a URI starting 3 weeks ago, and everything resolved except for this left-sided nasal congestion.  No sinus pain or pressure, facial swelling, upper dental pain, rhinorrhea, allergy symptoms.  No antibiotics in the past month.  No antipyretic in the past 6 hours.  He has been taking Mucinex and Robitussin with improvement in his symptoms.  No aggravating factors.  He has a past medical history of seasonal allergies, takes Zyrtec in the summer only, hypertension.  DDU:KGURKYH, Kinnie Scales, NP    Past Medical History:  Diagnosis Date   Arm pain, right    Arthritis    Back pain with radiation    Headache(784.0)    High cholesterol    Hypertension     Past Surgical History:  Procedure Laterality Date   ANTERIOR CERVICAL DECOMP/DISCECTOMY FUSION  07/28/2011   Procedure: ANTERIOR CERVICAL DECOMPRESSION/DISCECTOMY FUSION 2 LEVELS;  Surgeon: Jeffie Pollock [facility];  Location: [facility] OR;  Service: Orthopedics;  Laterality: N/A;  ACDF C6-7 and C7-T1   HAND ARTHROPLASTY      Family History  Problem Relation Age of Onset   Stomach cancer Mother    Prostate cancer Father     Social History   Tobacco Use   Smoking status: Former    Packs/day: 0.50    Types: Cigarettes    Quit date: 01/02/2013    Years since quitting: 8.3   Smokeless tobacco: Never  Vaping Use   Vaping Use: Never used  Substance Use Topics   Alcohol use: Yes    Comment: occasional beer   Drug use: No    Comment: prescription hydrocodone    No current facility-administered medications for this encounter.  Current Outpatient Medications:    amoxicillin-clavulanate (AUGMENTIN) 875-125 MG tablet,  Take 1 tablet by mouth 2 (two) times daily. X 7 days, Disp: 14 tablet, Rfl: 0   atorvastatin (LIPITOR) 40 MG tablet, TAKE 1 TABLET(40 MG) BY MOUTH AT BEDTIME, Disp: 90 tablet, Rfl: 1   fluticasone (FLONASE) 50 MCG/ACT nasal spray, Place 2 sprays into both nostrils daily., Disp: 16 g, Rfl: 0   hydrochlorothiazide (HYDRODIURIL) 25 MG tablet, TAKE 1 TABLET(25 MG) BY MOUTH DAILY, Disp: 90 tablet, Rfl: 1   lisinopril (ZESTRIL) 20 MG tablet, TAKE 1 TABLET(20 MG) BY MOUTH DAILY, Disp: 90 tablet, Rfl: 1   omeprazole (PRILOSEC) 20 MG capsule, Take  1 tablet daily as needed for heartburn/indigestion, Disp: 90 capsule, Rfl: 0  Allergies  Allergen Reactions   Fish-Derived Products Swelling   Iodinated Diagnostic Agents Other (See Comments)   Peanut-Containing Drug Products Swelling     ROS  As noted in HPI.   Physical Exam  BP 129/75 (BP Location: Left Arm)   Pulse 87   Temp 98.9 F (37.2 C) (Oral)   Resp 20   SpO2 97%   Constitutional: Well developed, well nourished, no acute distress Eyes:  EOMI, conjunctiva normal bilaterally HENT: Normocephalic, atraumatic,mucus membranes moist.  Positive purulent nasal congestion left side.  Erythematous, swollen turbinates.  No maxillary, frontal sinus tenderness.  No obvious postnasal drip Respiratory: Normal inspiratory effort Cardiovascular: Normal rate GI: nondistended skin: No rash, skin intact Musculoskeletal: no  deformities Neurologic: Alert & oriented x 3, no focal neuro deficits Psychiatric: Speech and behavior appropriate   ED Course   Medications - No data to display  No orders of the defined types were placed in this encounter.   No results found for this or any previous visit (from the past 24 hour(s)). No results found.  ED Clinical Impression  1. Acute non-recurrent pansinusitis      ED Assessment/Plan  Suspect secondary bacterial infection from recent URI.  Will send home with Augmentin given duration of symptoms  and foul-smelling, purulent nasal drainage.  Flonase, saline nasal irrigation, continue Mucinex, follow-up with PMD as needed.  Discussed MDM, treatment plan, and plan for follow-up with patient.  patient agrees with plan.   Meds ordered this encounter  Medications   amoxicillin-clavulanate (AUGMENTIN) 875-125 MG tablet    Sig: Take 1 tablet by mouth 2 (two) times daily. X 7 days    Dispense:  14 tablet    Refill:  0   fluticasone (FLONASE) 50 MCG/ACT nasal spray    Sig: Place 2 sprays into both nostrils daily.    Dispense:  16 g    Refill:  0      *This clinic note was created using Scientist, clinical (histocompatibility and immunogenetics). Therefore, there may be occasional mistakes despite careful proofreading.  ?    Domenick Gong, MD 05/13/21 [phone]

## 2021-05-13 NOTE — Discharge Instructions (Addendum)
Continue Mucinex to keep the mucous thin and to decongest you.  Flonase will also help.   Use a NeilMed sinus rinse with distilled water as often as you want to to reduce nasal congestion. Follow the directions on the box.   Go to www.goodrx.com to look up your medications. This will give you a list of where you can find your prescriptions at the most affordable prices. Or you can ask the pharmacist what the cash price is. This is frequently cheaper than going through insurance.

## 2021-08-02 ENCOUNTER — Encounter (HOSPITAL_COMMUNITY): Payer: Self-pay | Admitting: Emergency Medicine

## 2021-08-02 ENCOUNTER — Other Ambulatory Visit: Payer: Self-pay

## 2021-08-02 ENCOUNTER — Ambulatory Visit (HOSPITAL_COMMUNITY)
Admission: EM | Admit: 2021-08-02 | Discharge: 2021-08-02 | Disposition: A | Discharge status: Home / Self Care | Payer: Medicare Other | Attending: Family Medicine | Admitting: Family Medicine

## 2021-08-02 DIAGNOSIS — J09X2 Influenza due to identified novel influenza A virus with other respiratory manifestations: Secondary | ICD-10-CM

## 2021-08-02 LAB — POC INFLUENZA A AND B ANTIGEN (URGENT CARE ONLY)
INFLUENZA A ANTIGEN, POC: POSITIVE — AB
INFLUENZA B ANTIGEN, POC: NEGATIVE

## 2021-08-02 MED ORDER — OSELTAMIVIR PHOSPHATE 75 MG PO CAPS: 75.0000 mg | ORAL_CAPSULE | Freq: Two times a day (BID) | ORAL | 0 refills | Status: DC

## 2021-08-02 NOTE — ED Provider Notes (Signed)
[facility]    CSN: [id number] Arrival date & time: 08/02/21  0801      History   Chief Complaint Chief Complaint  Patient presents with   Sore Throat   Nasal Congestion    HPI Daniel Kemp is a 61 y.o. male.    Sore Throat  Here for sore throat, congestion/rhinorrhea, and cough since yesterday. Overnight he had chills and sweats, but did not have a thermometer to measure his temperature. No n/v/d.   PMH: htn, bp good today at 118/81. CMP in September shows Cr of 1.22  Past Medical History:  Diagnosis Date   Arm pain, right    Arthritis    Back pain with radiation    Headache(784.0)    High cholesterol    Hypertension     Patient Active Problem List   Diagnosis Date Noted   Lumbosacral radiculopathy at L5 03/06/2018    Past Surgical History:  Procedure Laterality Date   ANTERIOR CERVICAL DECOMP/DISCECTOMY FUSION  07/28/2011   Procedure: ANTERIOR CERVICAL DECOMPRESSION/DISCECTOMY FUSION 2 LEVELS;  Surgeon: Jeffie Pollock [facility];  Location: [facility] OR;  Service: Orthopedics;  Laterality: N/A;  ACDF C6-7 and C7-T1   HAND ARTHROPLASTY         Home Medications    Prior to Admission medications   Medication Sig Start Date End Date Taking? Authorizing Provider  oseltamivir (TAMIFLU) 75 MG capsule Take 1 capsule (75 mg total) by mouth every 12 (twelve) hours. 08/02/21  Yes Zenia Resides, MD  atorvastatin (LIPITOR) 40 MG tablet TAKE 1 TABLET(40 MG) BY MOUTH AT BEDTIME 04/05/21   Grayce Sessions, NP  fluticasone [facility]) 50 MCG/ACT nasal spray Place 2 sprays into both nostrils daily. 05/13/21   Domenick Gong, MD  hydrochlorothiazide (HYDRODIURIL) 25 MG tablet TAKE 1 TABLET(25 MG) BY MOUTH DAILY 03/31/21   Grayce Sessions, NP  lisinopril (ZESTRIL) 20 MG tablet TAKE 1 TABLET(20 MG) BY MOUTH DAILY 03/31/21   Grayce Sessions, NP  omeprazole (PRILOSEC) 20 MG capsule Take  1 tablet daily as needed for heartburn/indigestion 03/31/21   Grayce Sessions, NP    Family History Family History  Problem Relation Age of Onset   Stomach cancer Mother    Prostate cancer Father     Social History Social History   Tobacco Use   Smoking status: Former    Packs/day: 0.50    Types: Cigarettes    Quit date: 01/02/2013    Years since quitting: 8.5   Smokeless tobacco: Never  Vaping Use   Vaping Use: Never used  Substance Use Topics   Alcohol use: Yes    Comment: occasional beer   Drug use: No    Comment: prescription hydrocodone     Allergies   Fish-derived products, Iodinated contrast media, and Peanut-containing drug products   Review of Systems Review of Systems   Physical Exam Triage Vital Signs ED Triage Vitals [08/02/21 0818]  Enc Vitals Group     BP 118/81     Pulse Rate 96     Resp 16     Temp 97.6 F (36.4 C)     Temp Source Oral     SpO2 98 %     Weight 232 lb 12.9 oz (105.6 kg)     Height 6\' 4"  (1.93 m)     Head Circumference      Peak Flow      Pain Score 0     Pain Loc  Pain Edu?      Excl. in GC?    No data found.  Updated Vital Signs BP 118/81 (BP Location: Left Arm)    Pulse 96    Temp 97.6 F (36.4 C) (Oral)    Resp 16    Ht 6\' 4"  (1.93 m)    Wt 105.6 kg    SpO2 98%    BMI 28.34 kg/m   Visual Acuity Right Eye Distance:   Left Eye Distance:   Bilateral Distance:    Right Eye Near:   Left Eye Near:    Bilateral Near:     Physical Exam Constitutional:      General: He is not in acute distress.    Appearance: He is not toxic-appearing or diaphoretic.  HENT:     Right Ear: Tympanic membrane and ear canal normal.     Left Ear: Tympanic membrane and ear canal normal.     Nose: Congestion present.     Mouth/Throat:     Mouth: Mucous membranes are moist.     Pharynx: Oropharyngeal exudate (white mucus in OP) present. No posterior oropharyngeal erythema.  Eyes:     Extraocular Movements: Extraocular movements intact.     Conjunctiva/sclera: Conjunctivae normal.     Pupils:  Pupils are equal, round, and reactive to light.  Cardiovascular:     Rate and Rhythm: Normal rate and regular rhythm.     Heart sounds: No murmur heard. Pulmonary:     Breath sounds: No stridor. No wheezing, rhonchi or rales.  Abdominal:     Palpations: Abdomen is soft.  Musculoskeletal:     Cervical back: Neck supple.  Lymphadenopathy:     Cervical: No cervical adenopathy.  Skin:    Coloration: Skin is not jaundiced or pale.  Neurological:     General: No focal deficit present.     Mental Status: He is alert and oriented to person, place, and time.  Psychiatric:        Behavior: Behavior normal.     UC Treatments / Results  Labs (all labs ordered are listed, but only abnormal results are displayed) Labs Reviewed  POC INFLUENZA A AND B ANTIGEN (URGENT CARE ONLY) - Abnormal; Notable for the following components:      Result Value   INFLUENZA A ANTIGEN, POC POSITIVE (*)    All other components within normal limits    EKG   Radiology No results found.  Procedures Procedures (including critical care time)  Medications Ordered in UC Medications - No data to display  Initial Impression / Assessment and Plan / UC Course  I have reviewed the triage vital signs and the nursing notes.  Pertinent labs & imaging results that were available during my care of the patient were reviewed by me and considered in my medical decision making (see chart for details).     Flu A positive Final Clinical Impressions(s) / UC Diagnoses   Final diagnoses:  Influenza due to identified novel influenza A virus with other respiratory manifestations     Discharge Instructions      Your flu test was positive. Take oseltamivir 75 mg 1 capsule twice daily for 5 days.      ED Prescriptions     Medication Sig Dispense Auth. Provider   oseltamivir (TAMIFLU) 75 MG capsule Take 1 capsule (75 mg total) by mouth every 12 (twelve) hours. 10 capsule Zenia ResidesBanister, Glyn Zendejas K, MD      PDMP not  reviewed this encounter.   Dondrea Clendenin,  Janace Aris, MD 08/02/21 [phone]

## 2021-08-02 NOTE — ED Triage Notes (Signed)
Pt reports nasal congestion, sweating, and sore throat since yesterday. Tried taking mucinex with no relief.

## 2021-08-02 NOTE — Discharge Instructions (Signed)
Your flu test was positive. Take oseltamivir 75 mg 1 capsule twice daily for 5 days.

## 2021-11-23 ENCOUNTER — Other Ambulatory Visit (INDEPENDENT_AMBULATORY_CARE_PROVIDER_SITE_OTHER): Payer: Self-pay | Admitting: Primary Care

## 2021-11-23 DIAGNOSIS — I10 Essential (primary) hypertension: Secondary | ICD-10-CM

## 2021-11-23 NOTE — Telephone Encounter (Signed)
Request routed to PCP ?

## 2021-11-25 ENCOUNTER — Other Ambulatory Visit (INDEPENDENT_AMBULATORY_CARE_PROVIDER_SITE_OTHER): Payer: Self-pay | Admitting: Primary Care

## 2021-11-25 DIAGNOSIS — I10 Essential (primary) hypertension: Secondary | ICD-10-CM

## 2021-12-28 ENCOUNTER — Other Ambulatory Visit (INDEPENDENT_AMBULATORY_CARE_PROVIDER_SITE_OTHER): Payer: Self-pay | Admitting: Primary Care

## 2021-12-28 DIAGNOSIS — I10 Essential (primary) hypertension: Secondary | ICD-10-CM

## 2021-12-28 NOTE — Telephone Encounter (Unsigned)
Copied from CRM [id number]. Topic: General - Other >> Dec 28, 2021 12:17 PM Gaetana Michaelis A wrote: Reason for CRM: Medication Refill - Medication: hydrochlorothiazide (HYDRODIURIL) 25 MG tablet [[id number]]   Has the patient contacted their pharmacy? Yes. The patient's been directed to contact their PCP (Agent: If no, request that the patient contact the pharmacy for the refill. If patient does not wish to contact the pharmacy document the reason why and proceed with request.) (Agent: If yes, when and what did the pharmacy advise?)  Preferred Pharmacy (with phone number or street name): hydrochlorothiazide (HYDRODIURIL) 25 MG tablet [[id number]]  Has the patient been seen for an appointment in the last year OR does the patient have an upcoming appointment? Yes.    Agent: Please be advised that RX refills may take up to 3 business days. We ask that you follow-up with your pharmacy.

## 2021-12-29 MED ORDER — HYDROCHLOROTHIAZIDE 25 MG PO TABS: ORAL_TABLET | ORAL | 0 refills | Status: DC
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
## 2022-01-11 ENCOUNTER — Ambulatory Visit (INDEPENDENT_AMBULATORY_CARE_PROVIDER_SITE_OTHER): Payer: Medicare Other | Admitting: Primary Care

## 2022-01-12 ENCOUNTER — Ambulatory Visit (INDEPENDENT_AMBULATORY_CARE_PROVIDER_SITE_OTHER): Payer: Medicare Other | Admitting: Primary Care

## 2022-01-12 ENCOUNTER — Encounter (INDEPENDENT_AMBULATORY_CARE_PROVIDER_SITE_OTHER): Payer: Self-pay | Admitting: Primary Care

## 2022-01-12 VITALS — BP 113/75 | HR 72 | Temp 98.2°F | Ht 76.0 in | Wt 235.6 lb

## 2022-01-12 DIAGNOSIS — I10 Essential (primary) hypertension: Secondary | ICD-10-CM

## 2022-01-12 DIAGNOSIS — E782 Mixed hyperlipidemia: Secondary | ICD-10-CM | POA: Diagnosis not present

## 2022-01-12 MED ORDER — LISINOPRIL 20 MG PO TABS: ORAL_TABLET | ORAL | 1 refills | Status: DC

## 2022-01-12 MED ORDER — HYDROCHLOROTHIAZIDE 25 MG PO TABS: ORAL_TABLET | ORAL | 0 refills | Status: DC

## 2022-01-12 NOTE — Progress Notes (Signed)
[facility]   Daniel Kemp is a 61 y.o. male presents for hypertension evaluation, Denies shortness of breath, headaches, chest pain or lower extremity edema, sudden onset, vision changes, unilateral weakness, dizziness, paresthesias   Patient reports adherence with medications.  Dietary habits include: monitor salt intake  Exercise habits include:yes walking track field Family / Social history: No   Past Medical History:  Diagnosis Date   Arm pain, right    Arthritis    Back pain with radiation    Headache(784.0)    High cholesterol    Hypertension    Past Surgical History:  Procedure Laterality Date   ANTERIOR CERVICAL DECOMP/DISCECTOMY FUSION  07/28/2011   Procedure: ANTERIOR CERVICAL DECOMPRESSION/DISCECTOMY FUSION 2 LEVELS;  Surgeon: Jeffie Pollock [facility];  Location: [facility] OR;  Service: Orthopedics;  Laterality: N/A;  ACDF C6-7 and C7-T1   HAND ARTHROPLASTY     Allergies  Allergen Reactions   Fish-Derived Products Swelling   Iodinated Contrast Media Other (See Comments)   Peanut-Containing Drug Products Swelling   Current Outpatient Medications on File Prior to Visit  Medication Sig Dispense Refill   atorvastatin (LIPITOR) 40 MG tablet TAKE 1 TABLET(40 MG) BY MOUTH AT BEDTIME 90 tablet 1   hydrochlorothiazide (HYDRODIURIL) 25 MG tablet TAKE 1 TABLET(25 MG) BY MOUTH DAILY 90 tablet 0   lisinopril (ZESTRIL) 20 MG tablet TAKE 1 TABLET(20 MG) BY MOUTH DAILY 30 tablet 0   omeprazole (PRILOSEC) 20 MG capsule Take  1 tablet daily as needed for heartburn/indigestion 90 capsule 0   No current facility-administered medications on file prior to visit.   Social History   Socioeconomic History   Marital status: Married    Spouse name: Not on file   Number of children: 4   Years of education: Environmental health practitioner    Highest education level: Some Kemp, no degree  Occupational History   Not on file  Tobacco Use   Smoking status: Former     Packs/day: 0.50    Types: Cigarettes    Quit date: 01/02/2013    Years since quitting: 9.0   Smokeless tobacco: Never  Vaping Use   Vaping Use: Never used  Substance and Sexual Activity   Alcohol use: Yes    Comment: occasional beer   Drug use: No    Comment: prescription hydrocodone   Sexual activity: Not on file  Other Topics Concern   Not on file  Social History Narrative   Lives at home with wife & son who is 52 y.o.   Left handed   Drinks 1 cup of caffeine daily   Social Determinants of Health   Financial Resource Strain: Medium Risk (04/30/2021)   Overall Financial Resource Strain (CARDIA)    Difficulty of Paying Living Expenses: Somewhat hard  Food Insecurity: Food Insecurity Present (04/30/2021)   Hunger Vital Sign    Worried About Running Out of Food in the Last Year: Sometimes true    Ran Out of Food in the Last Year: Sometimes true  Transportation Needs: No Transportation Needs (04/30/2021)   PRAPARE - Administrator, Civil Service (Medical): No    Lack of Transportation (Non-Medical): No  Physical Activity: Inactive (04/30/2021)   Exercise Vital Sign    Days of Exercise per Week: 0 days    Minutes of Exercise per Session: 0 min  Stress: Stress Concern Present (04/30/2021)   [employer] of Occupational Health - Occupational Stress Questionnaire    Feeling of Stress : To some extent  Social Connections: Socially Integrated (04/30/2021)   Social Connection and Isolation Panel [NHANES]    Frequency of Communication with Friends and Family: More than three times a week    Frequency of Social Gatherings with Friends and Family: Once a week    Attends Religious Services: More than 4 times per year    Active Member of [employer] or Organizations: Yes    Attends Engineer, structural: More than 4 times per year    Marital Status: Married  Catering manager Violence: Not At Risk (04/30/2021)   Humiliation, Afraid, Rape, and Kick questionnaire    Fear of  Current or Ex-Partner: No    Emotionally Abused: No    Physically Abused: No    Sexually Abused: No   Family History  Problem Relation Age of Onset   Stomach cancer Mother    Prostate cancer Father      OBJECTIVE:  Vitals:   01/12/22 1547  BP: 113/75  Pulse: 72  Temp: 98.2 F (36.8 C)  TempSrc: Oral  SpO2: 96%  Weight: 235 lb 9.6 oz (106.9 kg)  Height: 6\' 4"  (1.93 m)    Physical Exam Vitals reviewed.  Constitutional:      Comments: Over weight   HENT:     Head: Normocephalic.     Right Ear: Tympanic membrane and external ear normal.     Left Ear: Tympanic membrane and external ear normal.     Nose: Nose normal.  Eyes:     Extraocular Movements: Extraocular movements intact.     Pupils: Pupils are equal, round, and reactive to light.  Cardiovascular:     Rate and Rhythm: Normal rate and regular rhythm.  Pulmonary:     Effort: Pulmonary effort is normal.     Breath sounds: Normal breath sounds.  Abdominal:     General: Bowel sounds are normal. There is distension.     Palpations: Abdomen is soft.  Musculoskeletal:        General: Normal range of motion.     Cervical back: Normal range of motion.  Skin:    General: Skin is warm and dry.  Neurological:     Mental Status: He is alert and oriented to person, place, and time.  Psychiatric:        Mood and Affect: Mood normal.        Behavior: Behavior normal.        Thought Content: Thought content normal.        Judgment: Judgment normal.    ROS Comprehensive ROS Pertinent positive and negative noted in HPI   Last 3 Office BP readings: BP Readings from Last 3 Encounters:  01/12/22 113/75  08/02/21 118/81  05/13/21 129/75    BMET    Component Value Date/Time   NA 140 04/01/2021 0825   K 4.5 04/01/2021 0825   CL 104 04/01/2021 0825   CO2 23 04/01/2021 0825   GLUCOSE 96 04/01/2021 0825   GLUCOSE 89 10/22/2012 0733   BUN 17 04/01/2021 0825   CREATININE 1.22 04/01/2021 0825   CALCIUM 9.4 04/01/2021  0825   GFRNONAA 64 08/27/2019 0840   GFRAA 75 08/27/2019 0840    Renal function: CrCl cannot be calculated (Patient's most recent lab result is older than the maximum 21 days allowed.).  Clinical ASCVD: Yes  The 10-year ASCVD risk score (Arnett DK, et al., 2019) is: 18.9%   Values used to calculate the score:     Age: 61 years  Sex: Male     Is Non-Hispanic African American: Yes     Diabetic: No     Tobacco smoker: Yes     Systolic Blood Pressure: 113 mmHg     Is BP treated: Yes     HDL Cholesterol: 42 mg/dL     Total Cholesterol: 180 mg/dL  ASCVD risk factors include- [place]   ASSESSMENT & PLAN: Daniel Kemp was seen today for hypertension.  Diagnoses and all orders for this visit:  Mixed hyperlipidemia  Healthy lifestyle diet of fruits vegetables fish nuts whole grains and low saturated fat . Foods high in cholesterol or liver, fatty meats,cheese, butter avocados, nuts and seeds, chocolate and fried foods. Lipid -future  Essential hypertension -Counseled on lifestyle modifications for blood pressure control including reduced dietary sodium, increased exercise, weight reduction and adequate sleep. Also, educated patient about the risk for cardiovascular events, stroke and heart attack. Also counseled patient about the importance of medication adherence. If you participate in smoking, it is important to stop using tobacco as this will increase the risks associated with uncontrolled blood pressure.  Goal BP:  For patients younger than 60: Goal BP < 130/80. For patients 60 and older: Goal BP < 140/90. For patients with diabetes: Goal BP < 130/80. Your most recent BP: 113/75  Minimize salt intake. Minimize alcohol intake  This note has been created with Education officer, environmentalDragon speech recognition software and smart phrase technology. Any transcriptional errors are unintentional.   Grayce SessionsMichelle P Dionisio Aragones, NP 01/12/2022, 4:06 PM

## 2022-01-13 ENCOUNTER — Other Ambulatory Visit (INDEPENDENT_AMBULATORY_CARE_PROVIDER_SITE_OTHER): Payer: Medicare Other

## 2022-01-13 DIAGNOSIS — E782 Mixed hyperlipidemia: Secondary | ICD-10-CM

## 2022-01-14 LAB — LIPID PANEL
Chol/HDL Ratio: 4.1 ratio (ref 0.0–5.0)
Cholesterol, Total: 184 mg/dL (ref 100–199)
HDL: 45 mg/dL (ref >39)
LDL Chol Calc (NIH): 123 mg/dL — ABNORMAL HIGH (ref 0–99)
Triglycerides: 87 mg/dL (ref 0–149)
VLDL Cholesterol Cal: 16 mg/dL (ref 5–40)

## 2022-01-18 ENCOUNTER — Other Ambulatory Visit (INDEPENDENT_AMBULATORY_CARE_PROVIDER_SITE_OTHER): Payer: Self-pay | Admitting: Primary Care

## 2022-03-11 ENCOUNTER — Other Ambulatory Visit (INDEPENDENT_AMBULATORY_CARE_PROVIDER_SITE_OTHER): Payer: Self-pay | Admitting: Primary Care

## 2022-03-11 DIAGNOSIS — K219 Gastro-esophageal reflux disease without esophagitis: Secondary | ICD-10-CM

## 2022-03-11 MED ORDER — OMEPRAZOLE 20 MG PO CPDR: DELAYED_RELEASE_CAPSULE | ORAL | 0 refills | Status: DC

## 2022-03-11 MED ORDER — CETIRIZINE HCL 10 MG PO TABS: 10.0000 mg | ORAL_TABLET | Freq: Every day | ORAL | 3 refills | Status: AC

## 2022-06-20 ENCOUNTER — Other Ambulatory Visit (INDEPENDENT_AMBULATORY_CARE_PROVIDER_SITE_OTHER): Payer: Self-pay | Admitting: Primary Care

## 2022-06-20 DIAGNOSIS — K219 Gastro-esophageal reflux disease without esophagitis: Secondary | ICD-10-CM
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
## 2022-07-15 ENCOUNTER — Encounter (HOSPITAL_COMMUNITY): Payer: Self-pay | Admitting: Emergency Medicine

## 2022-07-15 ENCOUNTER — Emergency Department (HOSPITAL_COMMUNITY)
Admission: EM | Admit: 2022-07-15 | Discharge: 2022-07-15 | Disposition: A | Discharge status: Home / Self Care | Payer: Medicare Other | Attending: Emergency Medicine | Admitting: Emergency Medicine

## 2022-07-15 ENCOUNTER — Other Ambulatory Visit: Payer: Self-pay

## 2022-07-15 ENCOUNTER — Emergency Department (HOSPITAL_COMMUNITY): Payer: Medicare Other

## 2022-07-15 DIAGNOSIS — Z91010 Allergy to peanuts: Secondary | ICD-10-CM | POA: Diagnosis not present

## 2022-07-15 DIAGNOSIS — M4802 Spinal stenosis, cervical region: Secondary | ICD-10-CM | POA: Diagnosis not present

## 2022-07-15 DIAGNOSIS — R599 Enlarged lymph nodes, unspecified: Secondary | ICD-10-CM | POA: Insufficient documentation

## 2022-07-15 DIAGNOSIS — M542 Cervicalgia: Secondary | ICD-10-CM | POA: Diagnosis present

## 2022-07-15 DIAGNOSIS — S161XXA Strain of muscle, fascia and tendon at neck level, initial encounter: Secondary | ICD-10-CM | POA: Diagnosis not present

## 2022-07-15 DIAGNOSIS — Y92410 Unspecified street and highway as the place of occurrence of the external cause: Secondary | ICD-10-CM | POA: Diagnosis not present

## 2022-07-15 DIAGNOSIS — S0990XA Unspecified injury of head, initial encounter: Secondary | ICD-10-CM | POA: Diagnosis not present

## 2022-07-15 MED ORDER — LIDOCAINE 5 % EX PTCH: 1.0000 | MEDICATED_PATCH | CUTANEOUS | 0 refills | Status: DC

## 2022-07-15 MED ORDER — LIDOCAINE 5 % EX PTCH
1.0000 | MEDICATED_PATCH | CUTANEOUS | Status: DC
  Administered 2022-07-15: 1 via TRANSDERMAL
  Filled 2022-07-15: qty 1

## 2022-07-15 MED ORDER — HYDROCODONE-ACETAMINOPHEN 5-325 MG PO TABS
1.0000 | ORAL_TABLET | Freq: Once | ORAL | Status: AC
  Administered 2022-07-15: 1 via ORAL
  Filled 2022-07-15: qty 1

## 2022-07-15 MED ORDER — IBUPROFEN 200 MG PO TABS
600.0000 mg | ORAL_TABLET | Freq: Once | ORAL | Status: AC
  Administered 2022-07-15: 600 mg via ORAL
  Filled 2022-07-15: qty 3
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
## 2022-07-18 ENCOUNTER — Telehealth (INDEPENDENT_AMBULATORY_CARE_PROVIDER_SITE_OTHER): Payer: Self-pay | Admitting: Primary Care
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
## 2022-07-19 NOTE — Telephone Encounter (Signed)
Pt is calling again wanting a soon appt  he states he has the note to be seen within 3 days and he also needs an appt for a med refill and can not get med with refill. FU [phone]

## 2022-07-20 ENCOUNTER — Encounter (INDEPENDENT_AMBULATORY_CARE_PROVIDER_SITE_OTHER): Payer: Self-pay

## 2022-08-10 ENCOUNTER — Encounter (INDEPENDENT_AMBULATORY_CARE_PROVIDER_SITE_OTHER): Payer: Self-pay | Admitting: Primary Care

## 2022-08-10 ENCOUNTER — Ambulatory Visit (INDEPENDENT_AMBULATORY_CARE_PROVIDER_SITE_OTHER): Payer: Medicare Other | Admitting: Primary Care

## 2022-08-10 VITALS — BP 100/71 | HR 68 | Resp 16 | Ht 75.0 in | Wt 236.8 lb

## 2022-08-10 DIAGNOSIS — I10 Essential (primary) hypertension: Secondary | ICD-10-CM

## 2022-08-10 DIAGNOSIS — M542 Cervicalgia: Secondary | ICD-10-CM

## 2022-08-10 DIAGNOSIS — M545 Low back pain, unspecified: Secondary | ICD-10-CM

## 2022-08-10 DIAGNOSIS — Z09 Encounter for follow-up examination after completed treatment for conditions other than malignant neoplasm: Secondary | ICD-10-CM

## 2022-08-10 NOTE — Patient Instructions (Signed)
Acute Back Pain, Adult Acute back pain is sudden and usually short-lived. It is often caused by an injury to the muscles and tissues in the back. The injury may result from: A muscle, tendon, or ligament getting overstretched or torn. Ligaments are tissues that connect bones to each other. Lifting something improperly can cause a back strain. Wear and tear (degeneration) of the spinal disks. Spinal disks are circular tissue that provide cushioning between the bones of the spine (vertebrae). Twisting motions, such as while playing sports or doing yard work. A hit to the back. Arthritis. You may have a physical exam, lab tests, and imaging tests to find the cause of your pain. Acute back pain usually goes away with rest and home care. Follow these instructions at home: Managing pain, stiffness, and swelling Take over-the-counter and prescription medicines only as told by your health care provider. Treatment may include medicines for pain and inflammation that are taken by mouth or applied to the skin, or muscle relaxants. Your health care provider may recommend applying ice during the first 24-48 hours after your pain starts. To do this: Put ice in a plastic bag. Place a towel between your skin and the bag. Leave the ice on for 20 minutes, 2-3 times a day. Remove the ice if your skin turns bright red. This is very important. If you cannot feel pain, heat, or cold, you have a greater risk of damage to the area. If directed, apply heat to the affected area as often as told by your health care provider. Use the heat source that your health care provider recommends, such as a moist heat pack or a heating pad. Place a towel between your skin and the heat source. Leave the heat on for 20-30 minutes. Remove the heat if your skin turns bright red. This is especially important if you are unable to feel pain, heat, or cold. You have a greater risk of getting burned. Activity  Do not stay in bed. Staying in  bed for more than 1-2 days can delay your recovery. Sit up and stand up straight. Avoid leaning forward when you sit or hunching over when you stand. If you work at a desk, sit close to it so you do not need to lean over. Keep your chin tucked in. Keep your neck drawn back, and keep your elbows bent at a 90-degree angle (right angle). Sit high and close to the steering wheel when you drive. Add lower back (lumbar) support to your car seat, if needed. Take short walks on even surfaces as soon as you are able. Try to increase the length of time you walk each day. Do not sit, drive, or stand in one place for more than 30 minutes at a time. Sitting or standing for long periods of time can put stress on your back. Do not drive or use heavy machinery while taking prescription pain medicine. Use proper lifting techniques. When you bend and lift, use positions that put less stress on your back: Bend your knees. Keep the load close to your body. Avoid twisting. Exercise regularly as told by your health care provider. Exercising helps your back heal faster and helps prevent back injuries by keeping muscles strong and flexible. Work with a physical therapist to make a safe exercise program, as recommended by your health care provider. Do any exercises as told by your physical therapist. Lifestyle Maintain a healthy weight. Extra weight puts stress on your back and makes it difficult to have good   posture. Avoid activities or situations that make you feel anxious or stressed. Stress and anxiety increase muscle tension and can make back pain worse. Learn ways to manage anxiety and stress, such as through exercise. General instructions Sleep on a firm mattress in a comfortable position. Try lying on your side with your knees slightly bent. If you lie on your back, put a pillow under your knees. Keep your head and neck in a straight line with your spine (neutral position) when using electronic equipment like  smartphones or pads. To do this: Raise your smartphone or pad to look at it instead of bending your head or neck to look down. Put the smartphone or pad at the level of your face while looking at the screen. Follow your treatment plan as told by your health care provider. This may include: Cognitive or behavioral therapy. Acupuncture or massage therapy. Meditation or yoga. Contact a health care provider if: You have pain that is not relieved with rest or medicine. You have increasing pain going down into your legs or buttocks. Your pain does not improve after 2 weeks. You have pain at night. You lose weight without trying. You have a fever or chills. You develop nausea or vomiting. You develop abdominal pain. Get help right away if: You develop new bowel or bladder control problems. You have unusual weakness or numbness in your arms or legs. You feel faint. These symptoms may represent a serious problem that is an emergency. Do not wait to see if the symptoms will go away. Get medical help right away. Call your local emergency services (911 in the [place] Do not drive yourself to the hospital. Summary Acute back pain is sudden and usually short-lived. Use proper lifting techniques. When you bend and lift, use positions that put less stress on your back. Take over-the-counter and prescription medicines only as told by your health care provider, and apply heat or ice as told. This information is not intended to replace advice given to you by your health care provider. Make sure you discuss any questions you have with your health care provider. Document Revised: 10/09/2020 Document Reviewed: 10/09/2020 Elsevier Patient Education  2023 Elsevier Inc.  
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
## 2022-11-09 ENCOUNTER — Ambulatory Visit (INDEPENDENT_AMBULATORY_CARE_PROVIDER_SITE_OTHER): Payer: Medicare Other | Admitting: Primary Care

## 2022-11-23 ENCOUNTER — Ambulatory Visit (INDEPENDENT_AMBULATORY_CARE_PROVIDER_SITE_OTHER): Payer: Medicare Other | Admitting: Primary Care

## 2022-11-23 ENCOUNTER — Encounter (INDEPENDENT_AMBULATORY_CARE_PROVIDER_SITE_OTHER): Payer: Self-pay | Admitting: Primary Care

## 2022-11-23 VITALS — BP 128/82 | HR 67 | Resp 16 | Ht 75.0 in | Wt 239.4 lb

## 2022-11-23 DIAGNOSIS — Z131 Encounter for screening for diabetes mellitus: Secondary | ICD-10-CM | POA: Diagnosis not present

## 2022-11-23 DIAGNOSIS — I10 Essential (primary) hypertension: Secondary | ICD-10-CM

## 2022-11-23 DIAGNOSIS — E782 Mixed hyperlipidemia: Secondary | ICD-10-CM

## 2022-11-23 MED ORDER — HYDROCHLOROTHIAZIDE 25 MG PO TABS: ORAL_TABLET | ORAL | 1 refills | Status: DC

## 2022-11-23 MED ORDER — ZOSTER VAC RECOMB ADJUVANTED 50 MCG/0.5ML IM SUSR: 0.5000 mL | Freq: Once | INTRAMUSCULAR | 1 refills | Status: AC

## 2022-11-23 NOTE — Progress Notes (Signed)
[facility]   Daniel Kemp is a 62 y.o. male presents for hypertension evaluation, Denies shortness of breath, headaches, chest pain or lower extremity edema, sudden onset, vision changes, unilateral weakness, dizziness, paresthesias   Patient reports adherence with medications.  Dietary habits include: DASH diet Exercise habits include:walking Family / Social history: Cancer   Past Medical History:  Diagnosis Date   Arm pain, right    Arthritis    Back pain with radiation    Headache(784.0)    High cholesterol    Hypertension    Past Surgical History:  Procedure Laterality Date   ANTERIOR CERVICAL DECOMP/DISCECTOMY FUSION  07/28/2011   Procedure: ANTERIOR CERVICAL DECOMPRESSION/DISCECTOMY FUSION 2 LEVELS;  Surgeon: [facility];  Location: [facility] OR;  Service: Orthopedics;  Laterality: N/A;  ACDF C6-7 and C7-T1   HAND ARTHROPLASTY     Allergies  Allergen Reactions   Fish-Derived Products Swelling   Iodinated Contrast Media Other (See Comments)   Peanut-Containing Drug Products Swelling   Current Outpatient Medications on File Prior to Visit  Medication Sig Dispense Refill   cetirizine (ZYRTEC) 10 MG tablet Take 1 tablet (10 mg total) by mouth daily. 90 tablet 3   hydrochlorothiazide (HYDRODIURIL) 25 MG tablet TAKE 1 TABLET(25 MG) BY MOUTH DAILY 90 tablet 0   lidocaine (LIDODERM) 5 % Place 1 patch onto the skin daily. Remove & Discard patch within 12 hours or as directed by MD (Patient not taking: Reported on 08/10/2022) 30 patch 0   omeprazole (PRILOSEC) 20 MG capsule TAKE 1 CAPSULE BY MOUTH DAILY AS NEEDED FOR HEARTBURN OR INDIGESTION 90 capsule 0   No current facility-administered medications on file prior to visit.   Social History   Socioeconomic History   Marital status: Married    Spouse name: Not on file   Number of children: 4   Years of education: Environmental health practitioner    Highest education level: Some college, no degree   Occupational History   Not on file  Tobacco Use   Smoking status: Former    Packs/day: .5    Types: Cigarettes    Quit date: 01/02/2013    Years since quitting: 9.8   Smokeless tobacco: Never  Vaping Use   Vaping Use: Never used  Substance and Sexual Activity   Alcohol use: Yes    Comment: occasional beer   Drug use: No    Comment: prescription hydrocodone   Sexual activity: Not on file  Other Topics Concern   Not on file  Social History Narrative   Lives at home with wife & son who is 55 y.o.   Left handed   Drinks 1 cup of caffeine daily   Social Determinants of Health   Financial Resource Strain: Patient Declined (11/19/2022)   Overall Financial Resource Strain (CARDIA)    Difficulty of Paying Living Expenses: Patient declined  Food Insecurity: Patient Declined (11/19/2022)   Hunger Vital Sign    Worried About Running Out of Food in the Last Year: Patient declined    Ran Out of Food in the Last Year: Patient declined  Transportation Needs: Patient Declined (11/19/2022)   PRAPARE - Administrator, Civil Service (Medical): Patient declined    Lack of Transportation (Non-Medical): Patient declined  Physical Activity: Unknown (11/19/2022)   Exercise Vital Sign    Days of Exercise per Week: Patient declined    Minutes of Exercise per Session: Not on file  Stress: No Stress Concern Present (11/19/2022)   [place]  [employer]stitute of Occupational Health - Occupational Stress Questionnaire    Feeling of Stress : Not at all  Social Connections: Unknown (11/19/2022)   Social Connection and Isolation Panel [NHANES]    Frequency of Communication with Friends and Family: Three times a week    Frequency of Social Gatherings with Friends and Family: Once a week    Attends Religious Services: Patient declined    Database administrator or Organizations: No    Attends Engineer, structural: Not on file    Marital Status: Married  Catering manager Violence: Not At Risk  (04/30/2021)   Humiliation, Afraid, Rape, and Kick questionnaire    Fear of Current or Ex-Partner: No    Emotionally Abused: No    Physically Abused: No    Sexually Abused: No   Family History  Problem Relation Age of Onset   Stomach cancer Mother    Prostate cancer Father      OBJECTIVE:  Vitals:   11/23/22 1046  BP: 128/82  Pulse: 67  Resp: 16  SpO2: 98%  Weight: 239 lb 6.4 oz (108.6 kg)  Height: 6\' 3"  (1.905 m)    Physical Exam Physical exam: General: Vital signs reviewed.  Patient is well-developed and well-nourished, overweight male in no acute distress and cooperative with exam. Head: Normocephalic and atraumatic. Eyes: EOMI, conjunctivae normal, no scleral icterus. Neck: Supple, trachea midline, normal ROM, no JVD, masses, thyromegaly, or carotid bruit present. Cardiovascular: RRR, S1 normal, S2 normal, no murmurs, gallops, or rubs. Pulmonary/Chest: Clear to auscultation bilaterally, no wheezes, rales, or rhonchi. Abdominal: Soft, non-tender, non-distended, BS +, no masses, organomegaly, or guarding present. Musculoskeletal: No joint deformities, erythema, or stiffness, ROM full and nontender. Extremities: No lower extremity edema bilaterally,  pulses symmetric and intact bilaterally. No cyanosis or clubbing. Neurological: A&O x3, Strength is normal Skin: Warm, dry and intact. No rashes or erythema. Psychiatric: Normal mood and affect. speech and behavior is normal. Cognition and memory are normal.     ROS Comprehensive ROS Pertinent positive and negative noted in HPI   Last 3 Office BP readings: BP Readings from Last 3 Encounters:  11/23/22 128/82  08/10/22 100/71  07/15/22 (Abnormal) 148/91    BMET    Component Value Date/Time   NA 140 04/01/2021 0825   K 4.5 04/01/2021 0825   CL 104 04/01/2021 0825   CO2 23 04/01/2021 0825   GLUCOSE 96 04/01/2021 0825   GLUCOSE 89 10/22/2012 0733   BUN 17 04/01/2021 0825   CREATININE 1.22 04/01/2021 0825    CALCIUM 9.4 04/01/2021 0825   GFRNONAA 64 08/27/2019 0840   GFRAA 75 08/27/2019 0840    Renal function: CrCl cannot be calculated (Patient's most recent lab result is older than the maximum 21 days allowed.).  Clinical ASCVD: Yes  The 10-year ASCVD risk score (Arnett DK, et al., 2019) is: 14.5%   Values used to calculate the score:     Age: 67 years     Sex: Male     Is Non-Hispanic African American: Yes     Diabetic: No     Tobacco smoker: No     Systolic Blood Pressure: 128 mmHg     Is BP treated: Yes     HDL Cholesterol: 45 mg/dL     Total Cholesterol: 184 mg/dL  ASCVD risk factors include- [place]   ASSESSMENT & PLAN:  Zabian was seen today for hypertension.  Diagnoses and all orders for this visit:  Mixed hyperlipidemia  Healthy lifestyle  diet of fruits vegetables fish nuts whole grains and low saturated fat . Foods high in cholesterol or liver, fatty meats,cheese, butter avocados, nuts and seeds, chocolate and fried foods. -     Lipid Panel  Screening for diabetes mellitus (DM) -     Hemoglobin A1c  Other orders -     Zoster Vaccine Adjuvanted [facility]) injection; Inject 0.5 mLs into the muscle once for 1 dose.   Essential hypertension -Counseled on lifestyle modifications for blood pressure control including reduced dietary sodium, increased exercise, weight reduction and adequate sleep. Also, educated patient about the risk for cardiovascular events, stroke and heart attack. Also counseled patient about the importance of medication adherence. If you participate in smoking, it is important to stop using tobacco as this will increase the risks associated with uncontrolled blood pressure.   CMP14+EGFR Goal BP:  For patients younger than 60: Goal BP < 130/80. For patients 60 and older: Goal BP < 140/90. For patients with diabetes: Goal BP < 130/80. Your most recent BP: 128/82  Minimize salt intake. Minimize alcohol intake    This note has been created with  Education officer, environmental. Any transcriptional errors are unintentional.   Grayce Sessions, NP 11/23/2022, 10:49 AM

## 2022-12-08 ENCOUNTER — Other Ambulatory Visit (INDEPENDENT_AMBULATORY_CARE_PROVIDER_SITE_OTHER): Payer: Medicare Other

## 2022-12-09 LAB — CMP14+EGFR
ALT: 16 IU/L (ref 0–44)
AST: 20 IU/L (ref 0–40)
Albumin/Globulin Ratio: 1.2 (ref 1.2–2.2)
Albumin: 4.2 g/dL (ref 3.9–4.9)
Alkaline Phosphatase: 107 IU/L (ref 44–121)
BUN/Creatinine Ratio: 9 — ABNORMAL LOW (ref 10–24)
BUN: 11 mg/dL (ref 8–27)
Bilirubin Total: 0.4 mg/dL (ref 0.0–1.2)
CO2: 24 mmol/L (ref 20–29)
Calcium: 9.4 mg/dL (ref 8.6–10.2)
Chloride: 102 mmol/L (ref 96–106)
Creatinine, Ser: 1.22 mg/dL (ref 0.76–1.27)
Globulin, Total: 3.6 g/dL (ref 1.5–4.5)
Glucose: 96 mg/dL (ref 70–99)
Potassium: 4.3 mmol/L (ref 3.5–5.2)
Sodium: 140 mmol/L (ref 134–144)
Total Protein: 7.8 g/dL (ref 6.0–8.5)
eGFR: 67 mL/min/{1.73_m2} (ref >59)

## 2022-12-09 LAB — HEMOGLOBIN A1C
Est. average glucose Bld gHb Est-mCnc: 131 mg/dL
Hgb A1c MFr Bld: 6.2 % — ABNORMAL HIGH (ref 4.8–5.6)

## 2022-12-09 LAB — LIPID PANEL
Chol/HDL Ratio: 6 ratio — ABNORMAL HIGH (ref 0.0–5.0)
Cholesterol, Total: 228 mg/dL — ABNORMAL HIGH (ref 100–199)
HDL: 38 mg/dL — ABNORMAL LOW (ref >39)
LDL Chol Calc (NIH): 172 mg/dL — ABNORMAL HIGH (ref 0–99)
Triglycerides: 101 mg/dL (ref 0–149)
VLDL Cholesterol Cal: 18 mg/dL (ref 5–40)

## 2022-12-16 ENCOUNTER — Other Ambulatory Visit (INDEPENDENT_AMBULATORY_CARE_PROVIDER_SITE_OTHER): Payer: Self-pay | Admitting: Primary Care

## 2022-12-16 ENCOUNTER — Other Ambulatory Visit (INDEPENDENT_AMBULATORY_CARE_PROVIDER_SITE_OTHER): Payer: Self-pay

## 2022-12-16 DIAGNOSIS — Z760 Encounter for issue of repeat prescription: Secondary | ICD-10-CM

## 2022-12-16 DIAGNOSIS — E782 Mixed hyperlipidemia: Secondary | ICD-10-CM

## 2022-12-16 MED ORDER — ATORVASTATIN CALCIUM 40 MG PO TABS: 40.0000 mg | ORAL_TABLET | Freq: Every day | ORAL | 1 refills | Status: DC

## 2023-02-15 ENCOUNTER — Ambulatory Visit (INDEPENDENT_AMBULATORY_CARE_PROVIDER_SITE_OTHER): Payer: Medicare Other | Admitting: Primary Care

## 2023-03-07 ENCOUNTER — Ambulatory Visit (INDEPENDENT_AMBULATORY_CARE_PROVIDER_SITE_OTHER): Payer: Medicare Other | Admitting: Primary Care

## 2023-03-14 ENCOUNTER — Telehealth (INDEPENDENT_AMBULATORY_CARE_PROVIDER_SITE_OTHER): Payer: Self-pay | Admitting: Primary Care

## 2023-03-14 NOTE — Telephone Encounter (Signed)
Spoke to pt. Will be at apt.  

## 2023-03-15 ENCOUNTER — Encounter (INDEPENDENT_AMBULATORY_CARE_PROVIDER_SITE_OTHER): Payer: Self-pay | Admitting: Primary Care

## 2023-03-15 ENCOUNTER — Ambulatory Visit (INDEPENDENT_AMBULATORY_CARE_PROVIDER_SITE_OTHER): Payer: Medicare Other | Admitting: Primary Care

## 2023-03-15 VITALS — BP 132/84 | HR 73 | Resp 16 | Wt 242.6 lb

## 2023-03-15 DIAGNOSIS — K219 Gastro-esophageal reflux disease without esophagitis: Secondary | ICD-10-CM

## 2023-03-15 DIAGNOSIS — I10 Essential (primary) hypertension: Secondary | ICD-10-CM | POA: Diagnosis not present

## 2023-03-15 MED ORDER — OMEPRAZOLE 20 MG PO CPDR: DELAYED_RELEASE_CAPSULE | ORAL | 1 refills | Status: DC

## 2023-03-15 NOTE — Progress Notes (Signed)
[facility]  Daniel Kemp, is a 62 y.o. male  WJX:[id number]  OZH:[id number]  DOB - [birth year]  Chief Complaint  Patient presents with   Hypertension       Subjective:   Daniel Kemp is a 62 y.o. male here today for a follow up visit for GERD. Requesting medication refill. Discussed eating small frequent meal and limited foods that can cause indigestion.  Patient has No headache, No chest pain, No abdominal pain - No Nausea, No new weakness tingling or numbness, No Cough - shortness of breath  No problems updated.  Allergies  Allergen Reactions   Fish-Derived Products Swelling   Iodinated Contrast Media Other (See Comments)   Peanut-Containing Drug Products Swelling    Past Medical History:  Diagnosis Date   Arm pain, right    Arthritis    Back pain with radiation    Headache(784.0)    High cholesterol    Hypertension     Current Outpatient Medications on File Prior to Visit  Medication Sig Dispense Refill   atorvastatin (LIPITOR) 40 MG tablet Take 1 tablet (40 mg total) by mouth daily. 90 tablet 1   cetirizine (ZYRTEC) 10 MG tablet Take 1 tablet (10 mg total) by mouth daily. 90 tablet 3   lidocaine (LIDODERM) 5 % Place 1 patch onto the skin daily. Remove & Discard patch within 12 hours or as directed by MD (Patient not taking: Reported on 08/10/2022) 30 patch 0   No current facility-administered medications on file prior to visit.    Objective:   Vitals:   03/15/23 1119 03/15/23 1121  BP: 137/81 132/84  Pulse: 73   Resp: 16   SpO2: 96%   Weight: 242 lb 9.6 oz (110 kg)     Comprehensive ROS Pertinent positive and negative noted in HPI   Exam General appearance : Awake, alert, not in any distress. Speech Clear. Not toxic looking HEENT: Atraumatic and Normocephalic, pupils equally reactive to light and accomodation Neck: Supple, no JVD. No cervical lymphadenopathy.  Chest: Good air entry bilaterally, no added sounds  CVS: S1 S2  regular, no murmurs.  Abdomen: Bowel sounds present, Non tender and not distended with no gaurding, rigidity or rebound. Extremities: B/L Lower Ext shows no edema, both legs are warm to touch Neurology: Awake alert, and oriented X 3, CN II-XII intact, Non focal Skin: No Rash  Data Review Lab Results  Component Value Date   HGBA1C 6.2 (H) 12/08/2022    Assessment & Plan  [name] was seen today for hypertension.  Diagnoses and all orders for this visit:  Gastroesophageal reflux disease, unspecified whether esophagitis present Discussed eating small frequent meal, reduction in acidic foods, fried foods ,spicy foods, alcohol caffeine and tobacco and certain medications. Avoid laying down after eating 19mins-1hour, elevated head of the bed.  -     omeprazole (PRILOSEC) 20 MG capsule; TAKE 1 CAPSULE BY MOUTH DAILY AS NEEDED FOR HEARTBURN OR INDIGESTION  Essential hypertension BP goal - is at goal < 140/90 DIET: Limit salt intake, read nutrition labels to check salt content, limit fried and high fatty foods  Avoid using multisymptom OTC cold preparations that generally contain sudafed which can rise BP. Consult with pharmacist on best cold relief products to use for persons with HTN EXERCISE Discussed incorporating exercise such as walking - 30 minutes most days of the week and can do in 10 minute intervals  On no medication       Patient have been counseled extensively about  nutrition and exercise. Other issues discussed during this visit include: low cholesterol diet, weight control and daily exercise, foot care, annual eye examinations at Ophthalmology, importance of adherence with medications and regular follow-up. We also discussed long term complications of uncontrolled diabetes and hypertension.   Return for MAW.  The patient was given clear instructions to go to ER or return to medical center if symptoms don't improve, worsen or new problems develop. The patient verbalized  understanding. The patient was told to call to get lab results if they haven't heard anything in the next week.   This note has been created with Education officer, environmental. Any transcriptional errors are unintentional.   Grayce Sessions, NP 03/15/2023, 11:33 AM

## 2023-03-17 ENCOUNTER — Ambulatory Visit (INDEPENDENT_AMBULATORY_CARE_PROVIDER_SITE_OTHER): Payer: Medicare Other

## 2023-03-17 DIAGNOSIS — I10 Essential (primary) hypertension: Secondary | ICD-10-CM | POA: Diagnosis not present

## 2023-03-17 DIAGNOSIS — E782 Mixed hyperlipidemia: Secondary | ICD-10-CM

## 2023-03-18 LAB — CMP14+EGFR
ALT: 15 IU/L (ref 0–44)
AST: 21 IU/L (ref 0–40)
Albumin: 4.2 g/dL (ref 3.9–4.9)
Alkaline Phosphatase: 84 IU/L (ref 44–121)
BUN/Creatinine Ratio: 10 (ref 10–24)
BUN: 12 mg/dL (ref 8–27)
Bilirubin Total: 0.3 mg/dL (ref 0.0–1.2)
CO2: 24 mmol/L (ref 20–29)
Calcium: 9.3 mg/dL (ref 8.6–10.2)
Chloride: 104 mmol/L (ref 96–106)
Creatinine, Ser: 1.24 mg/dL (ref 0.76–1.27)
Globulin, Total: 3 g/dL (ref 1.5–4.5)
Glucose: 93 mg/dL (ref 70–99)
Potassium: 4.6 mmol/L (ref 3.5–5.2)
Sodium: 141 mmol/L (ref 134–144)
Total Protein: 7.2 g/dL (ref 6.0–8.5)
eGFR: 65 mL/min/{1.73_m2} (ref >59)

## 2023-03-18 LAB — CBC WITH DIFFERENTIAL/PLATELET
Basophils Absolute: 0 10*3/uL (ref 0.0–0.2)
Basos: 1 %
EOS (ABSOLUTE): 0.2 10*3/uL (ref 0.0–0.4)
Eos: 4 %
Hematocrit: 42.4 % (ref 37.5–51.0)
Hemoglobin: 13.9 g/dL (ref 13.0–17.7)
Immature Grans (Abs): 0 10*3/uL (ref 0.0–0.1)
Immature Granulocytes: 0 %
Lymphocytes Absolute: 1.9 10*3/uL (ref 0.7–3.1)
Lymphs: 45 %
MCH: 27.5 pg (ref 26.6–33.0)
MCHC: 32.8 g/dL (ref 31.5–35.7)
MCV: 84 fL (ref 79–97)
Monocytes Absolute: 0.6 10*3/uL (ref 0.1–0.9)
Monocytes: 15 %
Neutrophils Absolute: 1.5 10*3/uL (ref 1.4–7.0)
Neutrophils: 35 %
Platelets: 305 10*3/uL (ref 150–450)
RBC: 5.05 x10E6/uL (ref 4.14–5.80)
RDW: 15 % (ref 11.6–15.4)
WBC: 4.2 10*3/uL (ref 3.4–10.8)

## 2023-03-18 LAB — LIPID PANEL
Chol/HDL Ratio: 5.3 ratio — ABNORMAL HIGH (ref 0.0–5.0)
Cholesterol, Total: 240 mg/dL — ABNORMAL HIGH (ref 100–199)
HDL: 45 mg/dL (ref >39)
LDL Chol Calc (NIH): 178 mg/dL — ABNORMAL HIGH (ref 0–99)
Triglycerides: 96 mg/dL (ref 0–149)
VLDL Cholesterol Cal: 17 mg/dL (ref 5–40)

## 2023-03-21 ENCOUNTER — Other Ambulatory Visit (INDEPENDENT_AMBULATORY_CARE_PROVIDER_SITE_OTHER): Payer: Self-pay | Admitting: Primary Care

## 2023-03-21 ENCOUNTER — Encounter (INDEPENDENT_AMBULATORY_CARE_PROVIDER_SITE_OTHER): Payer: Self-pay | Admitting: Primary Care

## 2023-03-21 ENCOUNTER — Other Ambulatory Visit (INDEPENDENT_AMBULATORY_CARE_PROVIDER_SITE_OTHER): Payer: Self-pay

## 2023-03-21 MED ORDER — ATORVASTATIN CALCIUM 40 MG PO TABS: 40.0000 mg | ORAL_TABLET | Freq: Every day | ORAL | 1 refills | Status: DC

## 2023-03-21 NOTE — Telephone Encounter (Signed)
Medication Refill - Medication: atorvastatin (LIPITOR) 40 MG tablet   Has the patient contacted their pharmacy? Yes.   Pt told to contact provider  Preferred Pharmacy (with phone number or street name):  [id number] [place], [place] - [address] AT [facility] Phone: [phone]  Fax: [phone]     Has the patient been seen for an appointment in the last year OR does the patient have an upcoming appointment? Yes.    Agent: Please be advised that RX refills may take up to 3 business days. We ask that you follow-up with your pharmacy.

## 2023-04-10 ENCOUNTER — Encounter (INDEPENDENT_AMBULATORY_CARE_PROVIDER_SITE_OTHER): Payer: Self-pay | Admitting: Primary Care

## 2023-04-10 ENCOUNTER — Ambulatory Visit (INDEPENDENT_AMBULATORY_CARE_PROVIDER_SITE_OTHER): Payer: Medicare Other

## 2023-04-10 ENCOUNTER — Encounter (INDEPENDENT_AMBULATORY_CARE_PROVIDER_SITE_OTHER): Payer: Self-pay

## 2023-04-10 VITALS — Ht 76.0 in | Wt 242.0 lb

## 2023-04-10 DIAGNOSIS — Z0100 Encounter for examination of eyes and vision without abnormal findings: Secondary | ICD-10-CM

## 2023-04-10 DIAGNOSIS — M5417 Radiculopathy, lumbosacral region: Secondary | ICD-10-CM

## 2023-04-10 DIAGNOSIS — Z0000 Encounter for general adult medical examination without abnormal findings: Secondary | ICD-10-CM

## 2023-04-10 NOTE — Patient Instructions (Signed)
Daniel Kemp , Thank you for taking time to come for your Medicare Wellness Visit. I appreciate your ongoing commitment to your health goals. Please review the following plan we discussed and let me know if I can assist you in the future.   Referrals/Orders/Follow-Ups/Clinician Recommendations:  You have been referred to [facility] for a complete eye exam. If you haven't heard from them in a few days, please call them to schedule your appointment.  [facility] [address] [place] [address] Phone: [phone]   This is a list of the screening recommended for you and due dates:  Health Maintenance  Topic Date Due   Zoster (Shingles) Vaccine (1 of 2) Never done   Medicare Annual Wellness Visit  04/30/2022   Flu Shot  03/02/2023   COVID-19 Vaccine (3 - 2023-24 season) 04/02/2023   Colon Cancer Screening  09/30/2026   DTaP/Tdap/Td vaccine (2 - Td or Tdap) 03/30/2028   Hepatitis C Screening  Completed   HIV Screening  Completed   HPV Vaccine  Aged Out    Advanced directives: (ACP Link)Information on Advanced Care Planning can be found at [facility] Advance Health Care Directives Advance Health Care Directives ([email or web address]   Next Medicare Annual Wellness Visit scheduled for next year: Yes  Preventive Care 23-11 Years Old, Male Preventive care refers to lifestyle choices and visits with your health care provider that can promote health and wellness. Preventive care visits are also called wellness exams. What can I expect for my preventive care visit? Counseling During your preventive care visit, your health care provider may ask about your: Medical history, including: Past medical problems. Family medical history. Current health, including: Emotional well-being. Home life and relationship well-being. Sexual activity. Lifestyle, including: Alcohol, nicotine or tobacco, and drug use. Access to firearms. Diet, exercise, and sleep habits. Safety  issues such as seatbelt and bike helmet use. Sunscreen use. Work and work Astronomer. Physical exam Your health care provider will check your: Height and weight. These may be used to calculate your BMI (body mass index). BMI is a measurement that tells if you are at a healthy weight. Waist circumference. This measures the distance around your waistline. This measurement also tells if you are at a healthy weight and may help predict your risk of certain diseases, such as type 2 diabetes and high blood pressure. Heart rate and blood pressure. Body temperature. Skin for abnormal spots. What immunizations do I need?  Vaccines are usually given at various ages, according to a schedule. Your health care provider will recommend vaccines for you based on your age, medical history, and lifestyle or other factors, such as travel or where you work. What tests do I need? Screening Your health care provider may recommend screening tests for certain conditions. This may include: Lipid and cholesterol levels. Diabetes screening. This is done by checking your blood sugar (glucose) after you have not eaten for a while (fasting). Hepatitis B test. Hepatitis C test. HIV (human immunodeficiency virus) test. STI (sexually transmitted infection) testing, if you are at risk. Lung cancer screening. Prostate cancer screening. Colorectal cancer screening. Talk with your health care provider about your test results, treatment options, and if necessary, the need for more tests. Follow these instructions at home: Eating and drinking  Eat a diet that includes fresh fruits and vegetables, whole grains, lean protein, and low-fat dairy products. Take vitamin and mineral supplements as recommended by your health care provider. Do not  drink alcohol if your health care provider tells you not to drink. If you drink alcohol: Limit how much you have to 0-2 drinks a day. Know how much alcohol is in your drink. In the  [place] one drink equals one 12 oz bottle of beer (355 mL), one 5 oz glass of wine (148 mL), or one 1 oz glass of hard liquor (44 mL). Lifestyle Brush your teeth every morning and night with fluoride toothpaste. Floss one time each day. Exercise for at least 30 minutes 5 or more days each week. Do not use any products that contain nicotine or tobacco. These products include cigarettes, chewing tobacco, and vaping devices, such as e-cigarettes. If you need help quitting, ask your health care provider. Do not use drugs. If you are sexually active, practice safe sex. Use a condom or other form of protection to prevent STIs. Take aspirin only as told by your health care provider. Make sure that you understand how much to take and what form to take. Work with your health care provider to find out whether it is safe and beneficial for you to take aspirin daily. Find healthy ways to manage stress, such as: Meditation, yoga, or listening to music. Journaling. Talking to a trusted person. Spending time with friends and family. Minimize exposure to UV radiation to reduce your risk of skin cancer. Safety Always wear your seat belt while driving or riding in a vehicle. Do not drive: If you have been drinking alcohol. Do not ride with someone who has been drinking. When you are tired or distracted. While texting. If you have been using any mind-altering substances or drugs. Wear a helmet and other protective equipment during sports activities. If you have firearms in your house, make sure you follow all gun safety procedures. What's next? Go to your health care provider once a year for an annual wellness visit. Ask your health care provider how often you should have your eyes and teeth checked. Stay up to date on all vaccines. This information is not intended to replace advice given to you by your health care provider. Make sure you discuss any questions you have with your health care  provider. Document Revised: 01/13/2021 Document Reviewed: 01/13/2021 Elsevier Patient Education  2024 [employer] Managing Pain Without Opioids Opioids are strong medicines used to treat moderate to severe pain. For some people, especially those who have long-term (chronic) pain, opioids may not be the best choice for pain management due to: Side effects like nausea, constipation, and sleepiness. The risk of addiction (opioid use disorder). The longer you take opioids, the greater your risk of addiction. Pain that lasts for more than 3 months is called chronic pain. Managing chronic pain usually requires more than one approach and is often provided by a team of health care providers working together (multidisciplinary approach). Pain management may be done at a pain management center or pain clinic. How to manage pain without the use of opioids Use non-opioid medicines Non-opioid medicines for pain may include: Over-the-counter or prescription non-steroidal anti-inflammatory drugs (NSAIDs). These may be the first medicines used for pain. They work well for muscle and bone pain, and they reduce swelling. Acetaminophen. This over-the-counter medicine may work well for milder pain but not swelling. Antidepressants. These may be used to treat chronic pain. A certain type of antidepressant (tricyclics) is often used. These medicines are given in lower doses for pain than when used for depression. Anticonvulsants. These are usually used to treat seizures but may  also reduce nerve (neuropathic) pain. Muscle relaxants. These relieve pain caused by sudden muscle tightening (spasms). You may also use a pain medicine that is applied to the skin as a patch, cream, or gel (topical analgesic), such as a numbing medicine. These may cause fewer side effects than medicines taken by mouth. Do certain therapies as directed Some therapies can help with pain management. They include: Physical therapy. You will do  exercises to gain strength and flexibility. A physical therapist may teach you exercises to move and stretch parts of your body that are weak, stiff, or painful. You can learn these exercises at physical therapy visits and practice them at home. Physical therapy may also involve: Massage. Heat wraps or applying heat or cold to affected areas. Electrical signals that interrupt pain signals (transcutaneous electrical nerve stimulation, TENS). Weak lasers that reduce pain and swelling (low-level laser therapy). Signals from your body that help you learn to regulate pain (biofeedback). Occupational therapy. This helps you to learn ways to function at home and work with less pain. Recreational therapy. This involves trying new activities or hobbies, such as a physical activity or drawing. Mental health therapy, including: Cognitive behavioral therapy (CBT). This helps you learn coping skills for dealing with pain. Acceptance and commitment therapy (ACT) to change the way you think and react to pain. Relaxation therapies, including muscle relaxation exercises and mindfulness-based stress reduction. Pain management counseling. This may be individual, family, or group counseling.  Receive medical treatments Medical treatments for pain management include: Nerve block injections. These may include a pain blocker and anti-inflammatory medicines. You may have injections: Near the spine to relieve chronic back or neck pain. Into joints to relieve back or joint pain. Into nerve areas that supply a painful area to relieve body pain. Into muscles (trigger point injections) to relieve some painful muscle conditions. A medical device placed near your spine to help block pain signals and relieve nerve pain or chronic back pain (spinal cord stimulation device). Acupuncture. Follow these instructions at home Medicines Take over-the-counter and prescription medicines only as told by your health care provider. If  you are taking pain medicine, ask your health care providers about possible side effects to watch out for. Do not drive or use heavy machinery while taking prescription opioid pain medicine. Lifestyle  Do not use drugs or alcohol to reduce pain. If you drink alcohol, limit how much you have to: 0-1 drink a day for women who are not pregnant. 0-2 drinks a day for men. Know how much alcohol is in a drink. In the [place] one drink equals one 12 oz bottle of beer (355 mL), one 5 oz glass of wine (148 mL), or one 1 oz glass of hard liquor (44 mL). Do not use any products that contain nicotine or tobacco. These products include cigarettes, chewing tobacco, and vaping devices, such as e-cigarettes. If you need help quitting, ask your health care provider. Eat a healthy diet and maintain a healthy weight. Poor diet and excess weight may make pain worse. Eat foods that are high in fiber. These include fresh fruits and vegetables, whole grains, and beans. Limit foods that are high in fat and processed sugars, such as fried and sweet foods. Exercise regularly. Exercise lowers stress and may help relieve pain. Ask your health care provider what activities and exercises are safe for you. If your health care provider approves, join an exercise class that combines movement and stress reduction. Examples include yoga and tai chi. Get  enough sleep. Lack of sleep may make pain worse. Lower stress as much as possible. Practice stress reduction techniques as told by your therapist. General instructions Work with all your pain management providers to find the treatments that work best for you. You are an important member of your pain management team. There are many things you can do to reduce pain on your own. Consider joining an online or in-person support group for people who have chronic pain. Keep all follow-up visits. This is important. Where to find more information You can find more information about managing  pain without opioids from: American Academy of Pain Medicine: painmed.org Institute for Chronic Pain: instituteforchronicpain.org American Chronic Pain Association: theacpa.org Contact a health care provider if: You have side effects from pain medicine. Your pain gets worse or does not get better with treatments or home therapy. You are struggling with anxiety or depression. Summary Many types of pain can be managed without opioids. Chronic pain may respond better to pain management without opioids. Pain is best managed when you and a team of health care providers work together. Pain management without opioids may include non-opioid medicines, medical treatments, physical therapy, mental health therapy, and lifestyle changes. Tell your health care providers if your pain gets worse or is not being managed well enough. This information is not intended to replace advice given to you by your health care provider. Make sure you discuss any questions you have with your health care provider. Document Revised: 10/28/2020 Document Reviewed: 10/28/2020 Elsevier Patient Education  2024 [employer].

## 2023-04-10 NOTE — Progress Notes (Signed)
 Because this visit was a virtual/telehealth visit,  certain criteria was not obtained, such a blood pressure, CBG if patient is a diabetic, and timed get up and go. Any medications not marked as "taking" was not mentioned during the medication reconciliation part of the visit. Any vitals not documented were not able to be obtained due to this being a telehealth visit. Vitals that have been documented are verbally provided by the patient.  Patient was unable to self-report a recent blood pressure reading due to a lack of equipment at home via telehealth.  Subjective:   Daniel Kemp is a 62 y.o. male who presents for Medicare Annual/Subsequent preventive examination.  Visit Complete: Virtual  I connected with  Lerry Paterson on 04/10/23 by a audio enabled telemedicine application and verified that I am speaking with the correct person using two identifiers.  Patient Location: Home  Provider Location: Home Office  I discussed the limitations of evaluation and management by telemedicine. The patient expressed understanding and agreed to proceed.  Patient Medicare AWV questionnaire was completed by the patient on 04/06/2023; I have confirmed that all information answered by patient is correct and no changes since this date.  Review of Systems     Cardiac Risk Factors include: advanced age (>69men, >91 women);dyslipidemia;male gender;sedentary lifestyle     Objective:    Today's Vitals   04/06/23 0848 04/10/23 1230  Weight:  242 lb (109.8 kg)  Height:  6\' 4"  (1.93 m)  PainSc: 3     Body mass index is 29.46 kg/m.     04/10/2023   12:34 PM 07/15/2022   12:14 PM 08/02/2021    8:20 AM 04/30/2021    6:24 PM 07/27/2011   10:15 AM  Advanced Directives  Does Patient Have a Medical Advance Directive? No No No No Patient does not have advance directive;Patient would like information  Would patient like information on creating a medical advance directive? No - Patient declined   No -  Patient declined Advance directive packet given  Pre-existing out of facility DNR order (yellow form or pink MOST form)     No    Current Medications (verified) Outpatient Encounter Medications as of 04/10/2023  Medication Sig   atorvastatin (LIPITOR) 40 MG tablet Take 1 tablet (40 mg total) by mouth daily.   cetirizine (ZYRTEC) 10 MG tablet Take 1 tablet (10 mg total) by mouth daily.   HYDROcodone-acetaminophen (NORCO) 10-325 MG tablet Take 1 tablet by mouth every 6 (six) hours.   naloxone (NARCAN) nasal spray 4 mg/0.1 mL Place 0.4 mg into the nose as directed.   omeprazole (PRILOSEC) 20 MG capsule TAKE 1 CAPSULE BY MOUTH DAILY AS NEEDED FOR HEARTBURN OR INDIGESTION   lidocaine (LIDODERM) 5 % Place 1 patch onto the skin daily. Remove & Discard patch within 12 hours or as directed by MD (Patient not taking: Reported on 08/10/2022)   No facility-administered encounter medications on file as of 04/10/2023.    Allergies (verified) Fish-derived products, Iodinated contrast media, and Peanut-containing drug products   History: Past Medical History:  Diagnosis Date   Allergy    Arm pain, right    Arthritis    Back pain with radiation    GERD (gastroesophageal reflux disease)    Headache(784.0)    High cholesterol    Hypertension    Past Surgical History:  Procedure Laterality Date   ANTERIOR CERVICAL DECOMP/DISCECTOMY FUSION  07/28/2011   Procedure: ANTERIOR CERVICAL DECOMPRESSION/DISCECTOMY FUSION 2 LEVELS;  Surgeon: Jeffie Pollock  Dumonski;  Location: [facility] OR;  Service: Orthopedics;  Laterality: N/A;  ACDF C6-7 and C7-T1   HAND ARTHROPLASTY     Family History  Problem Relation Age of Onset   Stomach cancer Mother    Prostate cancer Father    Social History   Socioeconomic History   Marital status: Married    Spouse name: Not on file   Number of children: 4   Years of education: Environmental health practitioner    Highest education level: Some college, no degree  Occupational History    Not on file  Tobacco Use   Smoking status: Former    Current packs/day: 0.00    Types: Cigarettes    Quit date: 01/02/2013    Years since quitting: 10.2   Smokeless tobacco: Never  Vaping Use   Vaping status: Never Used  Substance and Sexual Activity   Alcohol use: Yes    Comment: occasional beer   Drug use: No    Comment: prescription hydrocodone   Sexual activity: Not on file  Other Topics Concern   Not on file  Social History Narrative   Lives at home with wife & son who is 4 y.o.   Left handed   Drinks 1 cup of caffeine daily   Social Determinants of Health   Financial Resource Strain: Medium Risk (04/06/2023)   Overall Financial Resource Strain (CARDIA)    Difficulty of Paying Living Expenses: Somewhat hard  Food Insecurity: No Food Insecurity (04/06/2023)   Hunger Vital Sign    Worried About Running Out of Food in the Last Year: Never true    Ran Out of Food in the Last Year: Never true  Transportation Needs: No Transportation Needs (04/06/2023)   PRAPARE - Administrator, Civil Service (Medical): No    Lack of Transportation (Non-Medical): No  Physical Activity: Unknown (04/06/2023)   Exercise Vital Sign    Days of Exercise per Week: 1 day    Minutes of Exercise per Session: Patient declined  Stress: No Stress Concern Present (04/06/2023)   [employer] of Occupational Health - Occupational Stress Questionnaire    Feeling of Stress : Not at all  Social Connections: Unknown (04/06/2023)   Social Connection and Isolation Panel [NHANES]    Frequency of Communication with Friends and Family: Twice a week    Frequency of Social Gatherings with Friends and Family: Patient declined    Attends Religious Services: More than 4 times per year    Active Member of [employer] or Organizations: No    Attends Engineer, structural: Never    Marital Status: Married    Tobacco Counseling Counseling given: Yes   Clinical Intake:  Pre-visit preparation completed:  Yes  Pain : 0-10 Pain Score: 3  Pain Location: Neck Pain Orientation: Posterior (pt has a history of a cervical fusion) Pain Descriptors / Indicators: Constant Pain Onset: More than a month ago Pain Frequency: Constant     BMI - recorded: 29.46 Nutritional Status: BMI 25 -29 Overweight Nutritional Risks: None Diabetes: No  How often do you need to have someone help you when you read instructions, pamphlets, or other written materials from your doctor or pharmacy?: (P) 1 - Never  Interpreter Needed?: No  Information entered by ::  Delani Kohli, CMa   Activities of Daily Living    04/06/2023    8:48 AM  In your present state of health, do you have any difficulty performing the following activities:  Hearing? 0  Vision? 0  Difficulty concentrating or making decisions? 0  Walking or climbing stairs? 0  Dressing or bathing? 0  Doing errands, shopping? 0  Preparing Food and eating ? N  Using the Toilet? N  In the past six months, have you accidently leaked urine? N  Do you have problems with loss of bowel control? N  Managing your Medications? N  Managing your Finances? N  Housekeeping or managing your Housekeeping? N    Patient Care Team: Grayce Sessions, NP as PCP - General (Internal Medicine)  Indicate any recent Medical Services you may have received from other than [facility] providers in the past year (date may be approximate).     Assessment:   This is a routine wellness examination for Madden.  Hearing/Vision screen Hearing Screening - Comments:: Patient denies any hearing difficulties.   Vision Screening - Comments:: [facility]   Goals Addressed             This Visit's Progress    Patient Stated       Get more assistance with medical bills.        Depression Screen    04/10/2023   12:35 PM 03/15/2023   11:25 AM 11/23/2022   10:45 AM 08/10/2022    9:01 AM 01/12/2022    3:46 PM 04/30/2021    6:25 PM 03/31/2021     9:51 AM  PHQ 2/9 Scores  PHQ - 2 Score 0 0 0 0 0 0 0    Fall Risk    04/06/2023    8:48 AM 03/15/2023   11:25 AM 11/23/2022   10:45 AM 08/10/2022    9:01 AM 01/12/2022    3:46 PM  Fall Risk   Falls in the past year? 0 0 0 1 0  Number falls in past yr: 0 0 0 0   Injury with Fall? 0 0 0 0   Risk for fall due to : No Fall Risks No Fall Risks No Fall Risks    Follow up Falls prevention discussed        MEDICARE RISK AT HOME: Medicare Risk at Home Any stairs in or around the home?: No If so, are there any without handrails?: No Home free of loose throw rugs in walkways, pet beds, electrical cords, etc?: No Adequate lighting in your home to reduce risk of falls?: Yes Life alert?: No Use of a cane, walker or w/c?: No Grab bars in the bathroom?: No Shower chair or bench in shower?: No Elevated toilet seat or a handicapped toilet?: No  TIMED UP AND GO:  Was the test performed?  No    Cognitive Function:        04/10/2023   12:35 PM 04/30/2021    6:27 PM  6CIT Screen  What Year? 0 points 0 points  What month? 0 points 0 points  What time? 0 points 0 points  Count back from 20 0 points 0 points  Months in reverse 0 points 0 points  Repeat phrase 0 points 0 points  Total Score 0 points 0 points    Immunizations Immunization History  Administered Date(s) Administered   PFIZER(Purple Top)SARS-COV-2 Vaccination 11/15/2019, 12/10/2019   Pneumococcal Polysaccharide-23 07/29/2011   Tdap 03/30/2018    TDAP status: Up to date  Flu Vaccine status: Due, Education has been provided regarding the importance of this vaccine. Advised may receive this vaccine at local pharmacy or Health Dept. Aware to provide a copy of the vaccination record if obtained  from local pharmacy or Health Dept. Verbalized acceptance and understanding.  Pneumococcal vaccine status: Not age appropriate for this patient.   Covid-19 vaccine status: Information provided on how to obtain vaccines.    Qualifies for Shingles Vaccine? Yes   Zostavax completed No   Shingrix Completed?: No.    Education has been provided regarding the importance of this vaccine. Patient has been advised to call insurance company to determine out of pocket expense if they have not yet received this vaccine. Advised may also receive vaccine at local pharmacy or Health Dept. Verbalized acceptance and understanding.  Screening Tests Health Maintenance  Topic Date Due   Zoster Vaccines- Shingrix (1 of 2) Never done   Medicare Annual Wellness (AWV)  04/30/2022   INFLUENZA VACCINE  03/02/2023   COVID-19 Vaccine (3 - 2023-24 season) 04/02/2023   Colonoscopy  09/30/2026   DTaP/Tdap/Td (2 - Td or Tdap) 03/30/2028   Hepatitis C Screening  Completed   HIV Screening  Completed   HPV VACCINES  Aged Out    Health Maintenance  Health Maintenance Due  Topic Date Due   Zoster Vaccines- Shingrix (1 of 2) Never done   Medicare Annual Wellness (AWV)  04/30/2022   INFLUENZA VACCINE  03/02/2023   COVID-19 Vaccine (3 - 2023-24 season) 04/02/2023    Colorectal cancer screening: Type of screening: Colonoscopy. Completed 09/29/2016. Repeat every 10 years  Lung Cancer Screening: (Low Dose CT Chest recommended if Age 42-80 years, 20 pack-year currently smoking OR have quit w/in 15years.) does not qualify.   Lung Cancer Screening Referral: na  Additional Screening:  Hepatitis C Screening: does not qualify; Completed 11/23/2017  Vision Screening: Recommended annual ophthalmology exams for early detection of glaucoma and other disorders of the eye. Is the patient up to date with their annual eye exam?  No  Who is the provider or what is the name of the office in which the patient attends annual eye exams? [facility] If pt is not established with a provider, would they like to be referred to a provider to establish care? Yes .   Dental Screening: Recommended annual dental exams for proper oral hygiene  Diabetic Foot  Exam: na  Community Resource Referral / Chronic Care Management: CRR required this visit?  No   CCM required this visit?  No     Plan:     I have personally reviewed and noted the following in the patient's chart:   Medical and social history Use of alcohol, tobacco or illicit drugs  Current medications and supplements including opioid prescriptions. Patient is currently taking opioid prescriptions. Information provided to patient regarding non-opioid alternatives. Patient advised to discuss non-opioid treatment plan with their provider. Functional ability and status Nutritional status Physical activity Advanced directives List of other physicians Hospitalizations, surgeries, and ER visits in previous 12 months Vitals Screenings to include cognitive, depression, and falls Referrals and appointments  In addition, I have reviewed and discussed with patient certain preventive protocols, quality metrics, and best practice recommendations. A written personalized care plan for preventive services as well as general preventive health recommendations were provided to patient.     Jordan Hawks Ermelinda Eckert, CMA   04/10/2023   After Visit Summary: (MyChart) Due to this being a telephonic visit, the after visit summary with patients personalized plan was offered to patient via MyChart   Nurse Notes: Referral for ophthalmology

## 2023-05-10 NOTE — Progress Notes (Signed)
I have reviewed the documentation in this encounter.  I agree with recommendations documented.

## 2023-05-18 ENCOUNTER — Telehealth (INDEPENDENT_AMBULATORY_CARE_PROVIDER_SITE_OTHER): Payer: Self-pay | Admitting: Primary Care

## 2023-05-18 ENCOUNTER — Telehealth (INDEPENDENT_AMBULATORY_CARE_PROVIDER_SITE_OTHER): Payer: Self-pay

## 2023-05-18 NOTE — Telephone Encounter (Signed)
Please schedule pt an appt with provider first

## 2023-05-18 NOTE — Telephone Encounter (Signed)
Please contact pt and schedule an appt for possible sleep apnea. Pt will need to see provider before placing referral

## 2023-05-18 NOTE — Telephone Encounter (Signed)
Pt is calling in because he needs to set up a sleep study for sleep apnea. Pt is requesting a referral so he can do the sleep study.

## 2023-05-18 NOTE — Telephone Encounter (Signed)
Copied from CRM [id number]. Topic: Referral - Request for Referral >> May 18, 2023  8:53 AM Patsy Lager T wrote: Has patient seen PCP for this complaint? No. Referral for which specialty: ENT Preferred provider/office: unknown Reason for referral: possible sleep apnea (sleep study)

## 2023-05-29 ENCOUNTER — Telehealth (INDEPENDENT_AMBULATORY_CARE_PROVIDER_SITE_OTHER): Payer: Self-pay | Admitting: Primary Care

## 2023-05-29 NOTE — Telephone Encounter (Signed)
Spoke to pt. Will be at apt.  

## 2023-05-30 ENCOUNTER — Telehealth (INDEPENDENT_AMBULATORY_CARE_PROVIDER_SITE_OTHER): Payer: Medicare Other | Admitting: Primary Care

## 2023-05-30 DIAGNOSIS — G4733 Obstructive sleep apnea (adult) (pediatric): Secondary | ICD-10-CM

## 2023-05-30 NOTE — Progress Notes (Signed)
[facility]  Virtual Visit via Telephone Note  I connected with Daniel Kemp, on 05/30/2023 at 10:28 AM video application and verified that I am speaking with the correct person using two identifiers.   Consent: I discussed the limitations, risks, security and privacy concerns of performing an evaluation and management service by telephone and the availability of in person appointments. I also discussed with the patient that there may be a patient responsible charge related to this service. The patient expressed understanding and agreed to proceed.   Location of Patient: Home Patient presents with possible obstructive sleep apnea. Patent has a 3 years history of symptoms of daytime fatigue, morning fatigue, and hypertension. Patient generally gets 4 or 5 hours of sleep per night, and states they generally have difficulty falling back asleep if awakened. Snoring of severe severity is present. Apneic episodes is unknown. Nasal obstruction is not present.  Patient has not had tonsillectomy.    Location of Provider: [facility]   Persons participating in Telemedicine visit: Feliciana Rossetti,  NP   History of Present Illness: Daniel Kemp is a 62 year old male patient voices concerns that he was told he needed to be evaluated for obstructive sleep apnea.  Patient does snore no known witnessed apnea.Patient has No headache, No chest pain, No abdominal pain - No Nausea, No new weakness tingling or numbness, No Cough - shortness of breath    Past Medical History:  Diagnosis Date   Allergy    Arm pain, right    Arthritis    Back pain with radiation    GERD (gastroesophageal reflux disease)    Headache(784.0)    High cholesterol    Hypertension    Allergies  Allergen Reactions   Fish-Derived Products Swelling   Iodinated Contrast Media Other (See Comments)   Peanut-Containing Drug Products  Swelling    Current Outpatient Medications on File Prior to Visit  Medication Sig Dispense Refill   atorvastatin (LIPITOR) 40 MG tablet Take 1 tablet (40 mg total) by mouth daily. 90 tablet 1   cetirizine (ZYRTEC) 10 MG tablet Take 1 tablet (10 mg total) by mouth daily. 90 tablet 3   HYDROcodone-acetaminophen (NORCO) 10-325 MG tablet Take 1 tablet by mouth every 6 (six) hours.     lidocaine (LIDODERM) 5 % Place 1 patch onto the skin daily. Remove & Discard patch within 12 hours or as directed by MD (Patient not taking: Reported on 08/10/2022) 30 patch 0   naloxone (NARCAN) nasal spray 4 mg/0.1 mL Place 0.4 mg into the nose as directed.     omeprazole (PRILOSEC) 20 MG capsule TAKE 1 CAPSULE BY MOUTH DAILY AS NEEDED FOR HEARTBURN OR INDIGESTION 90 capsule 1   No current facility-administered medications on file prior to visit.    Observations/Objective: See HPI  Assessment and Plan: Diagnoses and all orders for this visit:  OSA (obstructive sleep apnea) -     Nocturnal polysomnography; Future     Follow Up Instructions: 3 months   I discussed the assessment and treatment plan with the patient. The patient was provided an opportunity to ask questions and all were answered. The patient agreed with the plan and demonstrated an understanding of the instructions.   The patient was advised to call back or seek an in-person evaluation if the symptoms worsen or if the condition fails to improve as anticipated.     I provided 15 minutes total of non-face-to-face  time during this encounter including median intraservice time, reviewing previous notes, investigations, ordering medications, medical decision making, coordinating care and patient verbalized understanding at the end of the visit.    This note has been created with Education officer, environmental. Any transcriptional errors are unintentional.   Grayce Sessions, NP 05/30/2023, 10:28 AM

## 2023-08-11 ENCOUNTER — Encounter (HOSPITAL_BASED_OUTPATIENT_CLINIC_OR_DEPARTMENT_OTHER): Payer: Medicare Other | Admitting: Internal Medicine

## 2023-09-16 ENCOUNTER — Other Ambulatory Visit (INDEPENDENT_AMBULATORY_CARE_PROVIDER_SITE_OTHER): Payer: Self-pay | Admitting: Primary Care

## 2023-09-16 DIAGNOSIS — K219 Gastro-esophageal reflux disease without esophagitis: Secondary | ICD-10-CM

## 2023-09-18 NOTE — Telephone Encounter (Signed)
 Requested by interface surescripts.  Requested Prescriptions  Pending Prescriptions Disp Refills   omeprazole (PRILOSEC) 20 MG capsule [Pharmacy Med Name: OMEPRAZOLE 20MG  CAPSULES] 90 capsule 1    Sig: TAKE 1 CAPSULE BY MOUTH DAILY AS NEEDED FOR HEARTBURN OR INDIGESTION     Gastroenterology: Proton Pump Inhibitors Passed - 09/18/2023 12:35 PM      Passed - Valid encounter within last 12 months    Recent Outpatient Visits           3 months ago OSA (obstructive sleep apnea)   [place] [facility] Grayce Sessions, NP   6 months ago Gastroesophageal reflux disease, unspecified whether esophagitis present   [place] [facility] Grayce Sessions, NP   9 months ago Essential hypertension   [place] [facility] Grayce Sessions, NP   1 year ago Hospital discharge follow-up   [place] [facility] Grayce Sessions, NP   1 year ago Mixed hyperlipidemia   [place] [facility] Grayce Sessions, NP

## 2023-09-22 ENCOUNTER — Encounter (HOSPITAL_BASED_OUTPATIENT_CLINIC_OR_DEPARTMENT_OTHER): Payer: Medicare Other | Admitting: Internal Medicine

## 2023-11-22 ENCOUNTER — Encounter (HOSPITAL_BASED_OUTPATIENT_CLINIC_OR_DEPARTMENT_OTHER): Payer: Medicare Other | Admitting: Internal Medicine

## 2023-11-29 ENCOUNTER — Encounter (INDEPENDENT_AMBULATORY_CARE_PROVIDER_SITE_OTHER): Payer: Self-pay | Admitting: Primary Care

## 2023-11-29 ENCOUNTER — Ambulatory Visit (INDEPENDENT_AMBULATORY_CARE_PROVIDER_SITE_OTHER): Admitting: Primary Care

## 2023-11-29 VITALS — BP 129/86 | HR 91 | Resp 16 | Ht 75.0 in | Wt 240.8 lb

## 2023-11-29 DIAGNOSIS — N529 Male erectile dysfunction, unspecified: Secondary | ICD-10-CM | POA: Diagnosis not present

## 2023-11-29 DIAGNOSIS — E782 Mixed hyperlipidemia: Secondary | ICD-10-CM | POA: Diagnosis not present

## 2023-11-29 DIAGNOSIS — I10 Essential (primary) hypertension: Secondary | ICD-10-CM | POA: Diagnosis not present

## 2023-11-29 DIAGNOSIS — R351 Nocturia: Secondary | ICD-10-CM

## 2023-11-29 DIAGNOSIS — Z2821 Immunization not carried out because of patient refusal: Secondary | ICD-10-CM | POA: Diagnosis not present

## 2023-11-29 DIAGNOSIS — R7303 Prediabetes: Secondary | ICD-10-CM

## 2023-11-29 MED ORDER — SILDENAFIL CITRATE 50 MG PO TABS
50.0000 mg | ORAL_TABLET | Freq: Every day | ORAL | 1 refills | Status: AC | PRN
Start: 2023-11-29 — End: ?

## 2023-11-29 NOTE — Progress Notes (Signed)
 [facility]  Daniel Kemp, is a 63 y.o. male  [id number]  [id number]  DOB - May 13, 1961  Chief Complaint  Patient presents with   Erectile Dysfunction    Pt is requesting viagra       Subjective:   Daniel Kemp is a 63 y.o. male here today for erectile dysfunction . Patient has No headache, No chest pain, No abdominal pain - No Nausea, No new weakness tingling or numbness, No Cough - shortness of breath. Brother has stag 4 renal cancer increase stress and anxiety. They do not have the same father.   Comprehensive ROS Pertinent positive and negative noted in HPI   Allergies  Allergen Reactions   Fish-Derived Products Swelling   Iodinated Contrast Media Other (See Comments)   Peanut-Containing Drug Products Swelling    Past Medical History:  Diagnosis Date   Allergy    Arm pain, right    Arthritis    Back pain with radiation    GERD (gastroesophageal reflux disease)    Headache(784.0)    High cholesterol    Hypertension     Current Outpatient Medications on File Prior to Visit  Medication Sig Dispense Refill   atorvastatin  (LIPITOR) 40 MG tablet Take 1 tablet (40 mg total) by mouth daily. 90 tablet 1   cetirizine  (ZYRTEC ) 10 MG tablet Take 1 tablet (10 mg total) by mouth daily. 90 tablet 3   HYDROcodone -acetaminophen  (NORCO) 10-325 MG tablet Take 1 tablet by mouth every 6 (six) hours.     lidocaine  (LIDODERM ) 5 % Place 1 patch onto the skin daily. Remove & Discard patch within 12 hours or as directed by MD (Patient not taking: Reported on 08/10/2022) 30 patch 0   naloxone (NARCAN) nasal spray 4 mg/0.1 mL Place 0.4 mg into the nose as directed.     omeprazole  (PRILOSEC) 20 MG capsule TAKE 1 CAPSULE BY MOUTH DAILY AS NEEDED FOR HEARTBURN OR INDIGESTION 90 capsule 1   No current facility-administered medications on file prior to visit.   Health Maintenance  Topic Date Due   COVID-19 Vaccine (3 - 2024-25 season) 04/02/2023   Zoster (Shingles)  Vaccine (1 of 2) 02/28/2024*   Flu Shot  03/01/2024   Medicare Annual Wellness Visit  04/09/2024   Colon Cancer Screening  09/30/2026   DTaP/Tdap/Td vaccine (2 - Td or Tdap) 03/30/2028   Hepatitis C Screening  Completed   HIV Screening  Completed   Pneumococcal Vaccination  Aged Out   HPV Vaccine  Aged Out   Meningitis B Vaccine  Aged Out  *Topic was postponed. The date shown is not the original due date.    Objective:   Vitals:   11/29/23 1439 11/29/23 1440  BP: (!) 136/91 129/86  Pulse: 91   Resp: 16   SpO2: 98%   Weight: 240 lb 12.8 oz (109.2 kg)   Height: 6\' 3"  (1.905 m)    BP Readings from Last 3 Encounters:  11/29/23 129/86  03/15/23 132/84  11/23/22 128/82   Physical Exam Vitals reviewed.  Constitutional:      Appearance: He is obese.  HENT:     Head: Normocephalic.     Right Ear: Tympanic membrane and external ear normal.     Left Ear: Tympanic membrane and external ear normal.     Nose: Nose normal.  Eyes:     Extraocular Movements: Extraocular movements intact.     Pupils: Pupils are equal, round, and reactive to light.  Cardiovascular:  Rate and Rhythm: Normal rate and regular rhythm.  Pulmonary:     Effort: Pulmonary effort is normal.     Breath sounds: Normal breath sounds.  Abdominal:     General: Bowel sounds are normal. There is distension.     Palpations: Abdomen is soft.  Musculoskeletal:        General: Normal range of motion.  Skin:    General: Skin is warm and dry.  Neurological:     Mental Status: He is oriented to person, place, and time.  Psychiatric:        Mood and Affect: Mood normal.        Behavior: Behavior normal.        Thought Content: Thought content normal.        Judgment: Judgment normal.    .  Assessment & Plan  Daniel Kemp was seen today for erectile dysfunction.  Diagnoses and all orders for this visit:  Herpes zoster vaccination declined  Mixed hyperlipidemia  Healthy lifestyle diet of fruits vegetables  fish nuts whole grains and low saturated fat . Foods high in cholesterol or liver, fatty meats,cheese, butter avocados, nuts and seeds, chocolate and fried foods. -     Lipid panel  Essential hypertension BP goal - < 130/80 Explained that having normal blood pressure is the goal and medications are helping to get to goal and maintain normal blood pressure. DIET: Limit salt intake, read nutrition labels to check salt content, limit fried and high fatty foods  Avoid using multisymptom OTC cold preparations that generally contain sudafed which can rise BP. Consult with pharmacist on best cold relief products to use for persons with HTN EXERCISE Discussed incorporating exercise such as walking - 30 minutes most days of the week and can do in 10 minute intervals    -     CMP14+EGFR  Erectile dysfunction associated with vasculopathy sildenafil (VIAGRA) 50 MG tablet Take 1 tablet (50 mg total) by mouth daily as needed for erectile dysfunction., Starting Wed 11/29/2023, Normal  Prediabetes -     CBC with Differential/Platelet -     Hemoglobin A1c Nocturia -     PSA  Other orders -     sildenafil (VIAGRA) 50 MG tablet; Take 1 tablet (50 mg total) by mouth daily as needed for erectile dysfunction.      Patient have been counseled extensively about nutrition and exercise. Other issues discussed during this visit include: low cholesterol diet, weight control and daily exercise, foot care, annual eye examinations at Ophthalmology, importance of adherence with medications and regular follow-up. We also discussed long term complications of uncontrolled diabetes and hypertension.   Return in about 3 months (around 02/28/2024) for medical conditions.  The patient was given clear instructions to go to ER or return to medical center if symptoms don't improve, worsen or new problems develop. The patient verbalized understanding. The patient was told to call to get lab results if they haven't heard anything  in the next week.   This note has been created with Education officer, environmental. Any transcriptional errors are unintentional.   Marius Siemens, NP 11/29/2023, 3:05 PM

## 2023-12-01 LAB — CBC WITH DIFFERENTIAL/PLATELET
Basophils Absolute: 0 10*3/uL (ref 0.0–0.2)
Basos: 1 %
EOS (ABSOLUTE): 0.2 10*3/uL (ref 0.0–0.4)
Eos: 3 %
Hematocrit: 46.1 % (ref 37.5–51.0)
Hemoglobin: 14.8 g/dL (ref 13.0–17.7)
Immature Grans (Abs): 0 10*3/uL (ref 0.0–0.1)
Immature Granulocytes: 0 %
Lymphocytes Absolute: 2.4 10*3/uL (ref 0.7–3.1)
Lymphs: 39 %
MCH: 28.6 pg (ref 26.6–33.0)
MCHC: 32.1 g/dL (ref 31.5–35.7)
MCV: 89 fL (ref 79–97)
Monocytes Absolute: 0.8 10*3/uL (ref 0.1–0.9)
Monocytes: 13 %
Neutrophils Absolute: 2.6 10*3/uL (ref 1.4–7.0)
Neutrophils: 44 %
Platelets: 296 10*3/uL (ref 150–450)
RBC: 5.17 x10E6/uL (ref 4.14–5.80)
RDW: 14.6 % (ref 11.6–15.4)
WBC: 6 10*3/uL (ref 3.4–10.8)

## 2023-12-01 LAB — LIPID PANEL
Chol/HDL Ratio: 3.8 ratio (ref 0.0–5.0)
Cholesterol, Total: 168 mg/dL (ref 100–199)
HDL: 44 mg/dL (ref >39)
LDL Chol Calc (NIH): 104 mg/dL — ABNORMAL HIGH (ref 0–99)
Triglycerides: 108 mg/dL (ref 0–149)
VLDL Cholesterol Cal: 20 mg/dL (ref 5–40)

## 2023-12-01 LAB — CMP14+EGFR
ALT: 27 IU/L (ref 0–44)
AST: 28 IU/L (ref 0–40)
Albumin: 4.4 g/dL (ref 3.9–4.9)
Alkaline Phosphatase: 101 IU/L (ref 44–121)
BUN/Creatinine Ratio: 12 (ref 10–24)
BUN: 16 mg/dL (ref 8–27)
Bilirubin Total: 0.3 mg/dL (ref 0.0–1.2)
CO2: 21 mmol/L (ref 20–29)
Calcium: 10 mg/dL (ref 8.6–10.2)
Chloride: 103 mmol/L (ref 96–106)
Creatinine, Ser: 1.39 mg/dL — ABNORMAL HIGH (ref 0.76–1.27)
Globulin, Total: 3.2 g/dL (ref 1.5–4.5)
Glucose: 98 mg/dL (ref 70–99)
Potassium: 4.1 mmol/L (ref 3.5–5.2)
Sodium: 139 mmol/L (ref 134–144)
Total Protein: 7.6 g/dL (ref 6.0–8.5)
eGFR: 57 mL/min/{1.73_m2} — ABNORMAL LOW (ref >59)

## 2023-12-01 LAB — HEMOGLOBIN A1C
Est. average glucose Bld gHb Est-mCnc: 126 mg/dL
Hgb A1c MFr Bld: 6 % — ABNORMAL HIGH (ref 4.8–5.6)

## 2023-12-01 LAB — PSA: Prostate Specific Ag, Serum: 2.9 ng/mL (ref 0.0–4.0)

## 2023-12-04 ENCOUNTER — Encounter: Payer: Self-pay | Admitting: Nurse Practitioner

## 2024-01-04 ENCOUNTER — Other Ambulatory Visit (INDEPENDENT_AMBULATORY_CARE_PROVIDER_SITE_OTHER): Payer: Self-pay | Admitting: Primary Care

## 2024-01-04 DIAGNOSIS — K219 Gastro-esophageal reflux disease without esophagitis: Secondary | ICD-10-CM

## 2024-01-04 NOTE — Telephone Encounter (Unsigned)
 Copied from CRM [id number]. Topic: Clinical - Medication Refill >> Jan 04, 2024  2:50 PM Thersia Flax C wrote: Medication:  omeprazole  (PRILOSEC) 20 MG capsule atorvastatin  (LIPITOR) 40 MG tablet    Has the patient contacted their pharmacy? No  This is the patient's preferred pharmacy:  [id number], [place] - [address] AT [facility] [address] [place] [address] Phone: [phone] Fax: [phone]  Is this the correct pharmacy for this prescription? Yes If no, delete pharmacy and type the correct one.   Has the prescription been filled recently? Yes  Is the patient out of the medication? Yes  Has the patient been seen for an appointment in the last year OR does the patient have an upcoming appointment? Yes  Can we respond through MyChart? Yes  Agent: Please be advised that Rx refills may take up to 3 business days. We ask that you follow-up with your pharmacy.

## 2024-01-05 MED ORDER — ATORVASTATIN CALCIUM 40 MG PO TABS: 40.0000 mg | ORAL_TABLET | Freq: Every day | ORAL | 1 refills | Status: DC

## 2024-01-05 MED ORDER — OMEPRAZOLE 20 MG PO CPDR: DELAYED_RELEASE_CAPSULE | ORAL | 1 refills | Status: AC

## 2024-01-05 NOTE — Telephone Encounter (Signed)
 Labs in date.    LOV 11/29/2023  Requested Prescriptions  Pending Prescriptions Disp Refills   atorvastatin  (LIPITOR) 40 MG tablet 90 tablet 1    Sig: Take 1 tablet (40 mg total) by mouth daily.     Cardiovascular:  Antilipid - Statins Failed - 01/05/2024  1:02 PM      Failed - Lipid Panel in normal range within the last 12 months    Cholesterol, Total  Date Value Ref Range Status  11/29/2023 168 100 - 199 mg/dL Final   LDL Chol Calc (NIH)  Date Value Ref Range Status  11/29/2023 104 (H) 0 - 99 mg/dL Final   HDL  Date Value Ref Range Status  11/29/2023 44 >39 mg/dL Final   Triglycerides  Date Value Ref Range Status  11/29/2023 108 0 - 149 mg/dL Final         Passed - Patient is not pregnant      Passed - Valid encounter within last 12 months    Recent Outpatient Visits           1 month ago Herpes zoster vaccination declined   [place] [facility] Marius Siemens, NP   7 months ago OSA (obstructive sleep apnea)   [place] [facility] Marius Siemens, NP   9 months ago Gastroesophageal reflux disease, unspecified whether esophagitis present   [place] [facility] Marius Siemens, NP   1 year ago Essential hypertension   [place] [facility] Marius Siemens, NP   1 year ago Hospital discharge follow-up   [place] [facility] Marius Siemens, NP               omeprazole  (PRILOSEC) 20 MG capsule 90 capsule 1    Sig: TAKE 1 CAPSULE BY MOUTH DAILY AS NEEDED FOR HEARTBURN OR INDIGESTION     Gastroenterology: Proton Pump Inhibitors Passed - 01/05/2024  1:02 PM      Passed - Valid encounter within last 12 months    Recent Outpatient Visits           1 month ago Herpes zoster vaccination declined   [place] [facility] Marius Siemens, NP   7 months ago OSA (obstructive sleep apnea)   [place] [facility] Marius Siemens, NP   9 months ago Gastroesophageal reflux disease, unspecified whether esophagitis present   [place] [facility] Marius Siemens, NP   1 year ago Essential hypertension   [place] [facility] Marius Siemens, NP   1 year ago Hospital discharge follow-up   [place] [facility] Marius Siemens, NP

## 2024-01-26 ENCOUNTER — Ambulatory Visit (HOSPITAL_BASED_OUTPATIENT_CLINIC_OR_DEPARTMENT_OTHER): Attending: Primary Care | Admitting: Internal Medicine

## 2024-01-26 ENCOUNTER — Encounter (HOSPITAL_BASED_OUTPATIENT_CLINIC_OR_DEPARTMENT_OTHER): Payer: Self-pay

## 2024-04-15 ENCOUNTER — Encounter (INDEPENDENT_AMBULATORY_CARE_PROVIDER_SITE_OTHER): Payer: Medicare Other

## 2024-04-16 ENCOUNTER — Ambulatory Visit (INDEPENDENT_AMBULATORY_CARE_PROVIDER_SITE_OTHER)

## 2024-04-16 ENCOUNTER — Encounter (INDEPENDENT_AMBULATORY_CARE_PROVIDER_SITE_OTHER): Payer: Self-pay

## 2024-04-16 VITALS — BP 129/86 | Ht 75.0 in | Wt 244.0 lb

## 2024-04-16 DIAGNOSIS — Z0000 Encounter for general adult medical examination without abnormal findings: Secondary | ICD-10-CM

## 2024-04-16 NOTE — Progress Notes (Signed)
 Because this visit was a virtual/telehealth visit,  certain criteria was not obtained, such a blood pressure, CBG if applicable, and timed get up and go. Any medications not marked as taking were not mentioned during the medication reconciliation part of the visit. Any vitals not documented were not able to be obtained due to this being a telehealth visit or patient was unable to self-report a recent blood pressure reading due to a lack of equipment at home via telehealth. Vitals that have been documented are verbally provided by the patient.  This visit was performed by a medical professional under my direct supervision. I was immediately available for consultation/collaboration. I have reviewed and agree with the Annual Wellness Visit documentation.  Subjective:   Daniel Kemp is a 63 y.o. who presents for a Medicare Wellness preventive visit.  As a reminder, Annual Wellness Visits don't include a physical exam, and some assessments may be limited, especially if this visit is performed virtually. We may recommend an in-person follow-up visit with your provider if needed.  Visit Complete: Virtual I connected with  Daniel Kemp on 04/16/24 by a audio enabled telemedicine application and verified that I am speaking with the correct person using two identifiers.  Patient Location: Home  Provider Location: Home Office  I discussed the limitations of evaluation and management by telemedicine. The patient expressed understanding and agreed to proceed.  Vital Signs: Because this visit was a virtual/telehealth visit, some criteria may be missing or patient reported. Any vitals not documented were not able to be obtained and vitals that have been documented are patient reported.  VideoDeclined- This patient declined Librarian, academic. Therefore the visit was completed with audio only.  Persons Participating in Visit: Patient.  AWV Questionnaire: Yes: Patient  Medicare AWV questionnaire was completed by the patient on 04/11/2024; I have confirmed that all information answered by patient is correct and no changes since this date.  Cardiac Risk Factors include: sedentary lifestyle;advanced age (>7men, >34 women);male gender;obesity (BMI >30kg/m2)     Objective:    Today's Vitals   04/16/24 1109  BP: 129/86  Weight: 244 lb (110.7 kg)  Height: 6' 3 (1.905 m)   Body mass index is 30.5 kg/m.     04/16/2024   11:14 AM 04/10/2023   12:34 PM 07/15/2022   12:14 PM 08/02/2021    8:20 AM 04/30/2021    6:24 PM 07/27/2011   10:15 AM  Advanced Directives  Does Patient Have a Medical Advance Directive? No No No No No Patient does not have advance directive;Patient would like information   Would patient like information on creating a medical advance directive? No - Patient declined No - Patient declined   No - Patient declined Advance directive packet given   Pre-existing out of facility DNR order (yellow form or pink MOST form)      No      Data saved with a previous flowsheet row definition    Current Medications (verified) Outpatient Encounter Medications as of 04/16/2024  Medication Sig   atorvastatin  (LIPITOR) 40 MG tablet Take 1 tablet (40 mg total) by mouth daily.   cetirizine  (ZYRTEC ) 10 MG tablet Take 1 tablet (10 mg total) by mouth daily.   HYDROcodone -acetaminophen  (NORCO) 10-325 MG tablet Take 1 tablet by mouth every 6 (six) hours.   naloxone (NARCAN) nasal spray 4 mg/0.1 mL Place 0.4 mg into the nose as directed.   omeprazole  (PRILOSEC) 20 MG capsule TAKE 1 CAPSULE BY MOUTH DAILY  AS NEEDED FOR HEARTBURN OR INDIGESTION   sildenafil  (VIAGRA ) 50 MG tablet Take 1 tablet (50 mg total) by mouth daily as needed for erectile dysfunction.   lidocaine  (LIDODERM ) 5 % Place 1 patch onto the skin daily. Remove & Discard patch within 12 hours or as directed by MD (Patient not taking: Reported on 08/10/2022)   No facility-administered encounter  medications on file as of 04/16/2024.    Allergies (verified) Fish-derived products, Iodinated contrast media, and Peanut-containing drug products   History: Past Medical History:  Diagnosis Date   Allergy    Arm pain, right    Arthritis    Back pain with radiation    GERD (gastroesophageal reflux disease)    Headache(784.0)    High cholesterol    Hypertension    Past Surgical History:  Procedure Laterality Date   ANTERIOR CERVICAL DECOMP/DISCECTOMY FUSION  07/28/2011   Procedure: ANTERIOR CERVICAL DECOMPRESSION/DISCECTOMY FUSION 2 LEVELS;  Surgeon: Oneil Ditch [facility];  Location: [facility] OR;  Service: Orthopedics;  Laterality: N/A;  ACDF C6-7 and C7-T1   HAND ARTHROPLASTY     Family History  Problem Relation Age of Onset   Stomach cancer Mother    Prostate cancer Father    Social History   Socioeconomic History   Marital status: Married    Spouse name: Not on file   Number of children: 64   Years of education: Environmental health practitioner    Highest education level: 12th grade  Occupational History   Not on file  Tobacco Use   Smoking status: Former    Current packs/day: 0.00    Types: Cigarettes    Quit date: 01/02/2013    Years since quitting: 11.2   Smokeless tobacco: Never  Vaping Use   Vaping status: Never Used  Substance and Sexual Activity   Alcohol use: Yes    Comment: occasional beer   Drug use: No    Comment: prescription hydrocodone    Sexual activity: Not on file  Other Topics Concern   Not on file  Social History Narrative   Lives at home with wife & son who is 1 y.o.   Left handed   Drinks 1 cup of caffeine daily   Social Drivers of Health   Financial Resource Strain: High Risk (04/16/2024)   Overall Financial Resource Strain (CARDIA)    Difficulty of Paying Living Expenses: Hard  Food Insecurity: Food Insecurity Present (04/16/2024)   Hunger Vital Sign    Worried About Running Out of Food in the Last Year: Sometimes true    Ran Out of Food in the  Last Year: Sometimes true  Transportation Needs: No Transportation Needs (04/16/2024)   PRAPARE - Administrator, Civil Service (Medical): No    Lack of Transportation (Non-Medical): No  Physical Activity: Insufficiently Active (04/16/2024)   Exercise Vital Sign    Days of Exercise per Week: 2 days    Minutes of Exercise per Session: 20 min  Stress: No Stress Concern Present (04/16/2024)   [employer] of Occupational Health - Occupational Stress Questionnaire    Feeling of Stress: Not at all  Social Connections: Unknown (04/16/2024)   Social Connection and Isolation Panel    Frequency of Communication with Friends and Family: Patient declined    Frequency of Social Gatherings with Friends and Family: Patient declined    Attends Religious Services: Patient declined    Database administrator or Organizations: No    Attends Banker Meetings: Never  Marital Status: Married    Tobacco Counseling Counseling given: Not Answered    Clinical Intake:  Pre-visit preparation completed: Yes  Pain : No/denies pain     BMI - recorded: 30.5 Nutritional Status: BMI > 30  Obese Nutritional Risks: None Diabetes: No  Lab Results  Component Value Date   HGBA1C 6.0 (H) 11/29/2023   HGBA1C 6.2 (H) 12/08/2022     How often do you need to have someone help you when you read instructions, pamphlets, or other written materials from your doctor or pharmacy?: 1 - Never  Interpreter Needed?: No  Information entered by :: Obaloluwa Delatte [email or web address]   Activities of Daily Living     04/11/2024    8:58 AM  In your present state of health, do you have any difficulty performing the following activities:  Hearing? 0  Vision? 0  Difficulty concentrating or making decisions? 0  Walking or climbing stairs? 1  Dressing or bathing? 0  Doing errands, shopping? 1  Preparing Food and eating ? N  Using the Toilet? N  In the past six months, have you accidently leaked urine? N  Do  you have problems with loss of bowel control? N  Managing your Medications? N  Managing your Finances? N  Housekeeping or managing your Housekeeping? Y    Patient Care Team: Celestia Rosaline SQUIBB, NP as PCP - General (Internal Medicine) Bonner Garnette ORN, PA (Orthopedic Surgery)  I have updated your Care Teams any recent Medical Services you may have received from other providers in the past year.     Assessment:   This is a routine wellness examination for Daniel Kemp.  Hearing/Vision screen Hearing Screening - Comments:: No difficulties Vision Screening - Comments:: Pt wears glasses   Goals Addressed             This Visit's Progress    Patient Stated       To be better than this year        Depression Screen     04/16/2024   11:15 AM 11/29/2023    2:51 PM 04/10/2023   12:35 PM 03/15/2023   11:25 AM 11/23/2022   10:45 AM 08/10/2022    9:01 AM 01/12/2022    3:46 PM  PHQ 2/9 Scores  PHQ - 2 Score 0 0 0 0 0 0 0  PHQ- 9 Score 0          Fall Risk     04/11/2024    8:58 AM 04/06/2023    8:48 AM 03/15/2023   11:25 AM 11/23/2022   10:45 AM 08/10/2022    9:01 AM  Fall Risk   Falls in the past year? 1 0 0 0 1  Number falls in past yr: 0 0 0 0 0  Injury with Fall? 0 0 0 0 0  Risk for fall due to : No Fall Risks No Fall Risks No Fall Risks No Fall Risks   Follow up Falls evaluation completed Falls prevention discussed       MEDICARE RISK AT HOME:  Medicare Risk at Home Any stairs in or around the home?: (Patient-Rptd) No If so, are there any without handrails?: (Patient-Rptd) No Home free of loose throw rugs in walkways, pet beds, electrical cords, etc?: (Patient-Rptd) Yes Adequate lighting in your home to reduce risk of falls?: (Patient-Rptd) Yes Life alert?: (Patient-Rptd) No Use of a cane, walker or w/c?: (Patient-Rptd) No Grab bars in the bathroom?: (Patient-Rptd) No Shower chair or bench in shower?: (Patient-Rptd) No  Elevated toilet seat or a handicapped toilet?:  (Patient-Rptd) No  TIMED UP AND GO:  Was the test performed?  No  Cognitive Function: 6CIT completed        04/16/2024   11:11 AM 04/10/2023   12:35 PM 04/30/2021    6:27 PM  6CIT Screen  What Year? 0 points 0 points 0 points  What month? 0 points 0 points 0 points  What time? 0 points 0 points 0 points  Count back from 20 0 points 0 points 0 points  Months in reverse 0 points 0 points 0 points  Repeat phrase 0 points 0 points 0 points  Total Score 0 points 0 points 0 points    Immunizations Immunization History  Administered Date(s) Administered   PFIZER(Purple Top)SARS-COV-2 Vaccination 11/15/2019, 12/10/2019   Pneumococcal Polysaccharide-23 07/29/2011   Tdap 03/30/2018    Screening Tests Health Maintenance  Topic Date Due   Zoster Vaccines- Shingrix (1 of 2) Never done   Pneumococcal Vaccine: 50+ Years (2 of 2 - PCV) 07/28/2012   Influenza Vaccine  03/01/2024   COVID-19 Vaccine (3 - 2025-26 season) 04/01/2024   Medicare Annual Wellness (AWV)  04/16/2025   Colonoscopy  09/30/2026   DTaP/Tdap/Td (2 - Td or Tdap) 03/30/2028   Hepatitis C Screening  Completed   HIV Screening  Completed   Hepatitis B Vaccines 19-59 Average Risk  Aged Out   HPV VACCINES  Aged Out   Meningococcal B Vaccine  Aged Out    Health Maintenance Items Addressed:patient declined at the moment    Additional Screening:  Vision Screening: Recommended annual ophthalmology exams for early detection of glaucoma and other disorders of the eye. Is the patient up to date with their annual eye exam?  No  Who is the provider or what is the name of the office in which the patient attends annual eye exams?   Dental Screening: Recommended annual dental exams for proper oral hygiene  Community Resource Referral / Chronic Care Management: CRR required this visit?  No   CCM required this visit?  No   Plan:    I have personally reviewed and noted the following in the patient's chart:   Medical and  social history Use of alcohol, tobacco or illicit drugs  Current medications and supplements including opioid prescriptions. Patient is not currently taking opioid prescriptions. Functional ability and status Nutritional status Physical activity Advanced directives List of other physicians Hospitalizations, surgeries, and ER visits in previous 12 months Vitals Screenings to include cognitive, depression, and falls Referrals and appointments  In addition, I have reviewed and discussed with patient certain preventive protocols, quality metrics, and best practice recommendations. A written personalized care plan for preventive services as well as general preventive health recommendations were provided to patient.   Daniel Kemp Right, [place]   04/16/2024   After Visit Summary: (MyChart) Due to this being a telephonic visit, the after visit summary with patients personalized plan was offered to patient via MyChart   Notes: Nothing significant to report at this time.

## 2024-04-16 NOTE — Patient Instructions (Signed)
 Daniel Kemp,  Thank you for taking the time for your Medicare Wellness Visit. I appreciate your continued commitment to your health goals. Please review the care plan we discussed, and feel free to reach out if I can assist you further.  Medicare recommends these wellness visits once per year to help you and your care team stay ahead of potential health issues. These visits are designed to focus on prevention, allowing your provider to concentrate on managing your acute and chronic conditions during your regular appointments.  Please note that Annual Wellness Visits do not include a physical exam. Some assessments may be limited, especially if the visit was conducted virtually. If needed, we may recommend a separate in-person follow-up with your provider.  Ongoing Care Seeing your primary care provider every 3 to 6 months helps us  monitor your health and provide consistent, personalized care.   Referrals If a referral was made during today's visit and you haven't received any updates within two weeks, please contact the referred provider directly to check on the status.  Recommended Screenings:  Health Maintenance  Topic Date Due   Zoster (Shingles) Vaccine (1 of 2) Never done   Pneumococcal Vaccine for age over 11 (2 of 2 - PCV) 07/28/2012   Flu Shot  03/01/2024   COVID-19 Vaccine (3 - 2025-26 season) 04/01/2024   Medicare Annual Wellness Visit  04/16/2025   Colon Cancer Screening  09/30/2026   DTaP/Tdap/Td vaccine (2 - Td or Tdap) 03/30/2028   Hepatitis C Screening  Completed   HIV Screening  Completed   Hepatitis B Vaccine  Aged Out   HPV Vaccine  Aged Out   Meningitis B Vaccine  Aged Out       04/16/2024   11:14 AM  Advanced Directives  Does Patient Have a Medical Advance Directive? No  Would patient like information on creating a medical advance directive? No - Patient declined   Advance Care Planning is important because it: Ensures you receive medical care that aligns  with your values, goals, and preferences. Provides guidance to your family and loved ones, reducing the emotional burden of decision-making during critical moments.  Vision: Annual vision screenings are recommended for early detection of glaucoma, cataracts, and diabetic retinopathy. These exams can also reveal signs of chronic conditions such as diabetes and high blood pressure.  Dental: Annual dental screenings help detect early signs of oral cancer, gum disease, and other conditions linked to overall health, including heart disease and diabetes.  Please see the attached documents for additional preventive care recommendations.

## 2024-04-18 ENCOUNTER — Telehealth (INDEPENDENT_AMBULATORY_CARE_PROVIDER_SITE_OTHER): Payer: Self-pay | Admitting: *Deleted

## 2024-04-18 NOTE — Telephone Encounter (Signed)
 Copied from CRM [id number]. Topic: General - Other >> Apr 18, 2024 12:24 PM DeAngela L wrote: Reason for CRM: patient calling to check the status of his form handicap plaque he dropped off at the office   Pt num [phone] (M)

## 2024-04-18 NOTE — Telephone Encounter (Signed)
 Patient states he dropped form off on Monday.  Advised that PCP is out of the office until 9/30 and would be able to complete the form upon her return.  He stated he does not have another provider that would be able to complete the form.   He verbalized understanding.

## 2024-05-21 ENCOUNTER — Telehealth (INDEPENDENT_AMBULATORY_CARE_PROVIDER_SITE_OTHER): Payer: Self-pay | Admitting: Primary Care

## 2024-05-21 NOTE — Telephone Encounter (Unsigned)
 Copied from CRM [id number]. Topic: General - Other >> May 21, 2024  3:00 PM Tiffini S wrote: Reason for CRM: Patient is calling to check the status of his form handicap plaque he dropped off at the office on 04/18/24- haven't heard anything back from the office for an update Called CAL twice, no answer   Please call the patient back at [phone]

## 2024-05-22 NOTE — Telephone Encounter (Signed)
 Will forward to provider  Haven't received back

## 2024-05-24 ENCOUNTER — Ambulatory Visit: Payer: Self-pay
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
## 2024-06-21 ENCOUNTER — Encounter (INDEPENDENT_AMBULATORY_CARE_PROVIDER_SITE_OTHER): Payer: Self-pay | Admitting: Primary Care

## 2024-06-21 ENCOUNTER — Ambulatory Visit (INDEPENDENT_AMBULATORY_CARE_PROVIDER_SITE_OTHER): Payer: Self-pay | Admitting: Primary Care

## 2024-06-21 VITALS — BP 121/75 | HR 85 | Resp 16 | Wt 244.0 lb

## 2024-06-21 DIAGNOSIS — M5417 Radiculopathy, lumbosacral region: Secondary | ICD-10-CM | POA: Diagnosis not present

## 2024-06-21 DIAGNOSIS — I10 Essential (primary) hypertension: Secondary | ICD-10-CM | POA: Diagnosis not present

## 2024-06-21 NOTE — Progress Notes (Signed)
 [facility]  Daniel Kemp, is a 63 y.o. male  RDW:[id number]  FMW:[id number]  DOB - Sep 25, 1960  Chief Complaint  Patient presents with   Hypertension       Subjective:   Daniel Kemp is a 63 y.o. male here today for an acute visit.  Patient complains of increased pain in his right index finger making it difficulty to grasp or even close his hand.  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  He is followed by orthopedics and was advised he needed to have ablation however there are 3 different copayments to have this procedure done and inquired about is there a orthopedics that can provide all his problems in 1 visit.  Informed clinical research associate he just got approved for Medicaid question if it was family-planning stated no his wife she is reseated check with diplomatic services operational officer and Medicaid database he only has Medicaid family-planning which pays for 1 physical a year and any type of birth control.  Patient is aware of this will discuss this with his wife when he gets actual Medicaid reevaluate problem evaluation and treatment.   No problems updated.  Comprehensive ROS Pertinent positive and negative noted in HPI   Allergies  Allergen Reactions   Fish Protein-Containing Drug Products Swelling   Iodinated Contrast Media Other (See Comments)   Peanut-Containing Drug Products Swelling    Past Medical History:  Diagnosis Date   Allergy    Arm pain, right    Arthritis    Back pain with radiation    GERD (gastroesophageal reflux disease)    Headache(784.0)    High cholesterol    Hypertension     Current Outpatient Medications on File Prior to Visit  Medication Sig Dispense Refill   atorvastatin  (LIPITOR) 40 MG tablet Take 1 tablet (40 mg total) by mouth daily. 90 tablet 1   cetirizine  (ZYRTEC ) 10 MG tablet Take 1 tablet (10 mg total) by mouth daily. 90 tablet 3   HYDROcodone -acetaminophen  (NORCO) 10-325 MG tablet Take 1 tablet  by mouth every 6 (six) hours.     lidocaine  (LIDODERM ) 5 % Place 1 patch onto the skin daily. Remove & Discard patch within 12 hours or as directed by MD (Patient not taking: Reported on 08/10/2022) 30 patch 0   naloxone (NARCAN) nasal spray 4 mg/0.1 mL Place 0.4 mg into the nose as directed.     omeprazole  (PRILOSEC) 20 MG capsule TAKE 1 CAPSULE BY MOUTH DAILY AS NEEDED FOR HEARTBURN OR INDIGESTION 90 capsule 1   sildenafil  (VIAGRA ) 50 MG tablet Take 1 tablet (50 mg total) by mouth daily as needed for erectile dysfunction. 10 tablet 1   No current facility-administered medications on file prior to visit.   Health Maintenance  Topic Date Due   Zoster (Shingles) Vaccine (1 of 2) Never done   Pneumococcal Vaccine for age over 86 (2 of 2 - PCV) 07/28/2012   COVID-19 Vaccine (3 - 2025-26 season) 04/01/2024   Flu Shot  10/29/2024*   Medicare Annual Wellness Visit  04/16/2025   Colon Cancer Screening  09/30/2026   DTaP/Tdap/Td vaccine (2 - Td or Tdap) 03/30/2028   Hepatitis C Screening  Completed   HIV Screening  Completed   Hepatitis B Vaccine  Aged Out   HPV Vaccine  Aged Out   Meningitis B Vaccine  Aged Out  *Topic was postponed. The date shown is not the original due date.    Objective:   Vitals:  06/21/24 1032  BP: 121/75  Pulse: 85  Resp: 16  SpO2: 98%  Weight: 244 lb (110.7 kg)   BP Readings from Last 3 Encounters:  06/21/24 121/75  04/16/24 129/86  11/29/23 129/86      Physical Exam Vitals reviewed.  Constitutional:      Appearance: He is obese.  HENT:     Head: Normocephalic.     Right Ear: Tympanic membrane, ear canal and external ear normal.     Left Ear: Tympanic membrane, ear canal and external ear normal.     Nose: Nose normal.  Eyes:     Extraocular Movements: Extraocular movements intact.     Pupils: Pupils are equal, round, and reactive to light.  Cardiovascular:     Rate and Rhythm: Normal rate and regular rhythm.  Pulmonary:     Effort: Pulmonary  effort is normal.     Breath sounds: Normal breath sounds.  Abdominal:     General: Bowel sounds are normal. There is distension.     Palpations: Abdomen is soft.  Musculoskeletal:        General: Normal range of motion.     Cervical back: Normal range of motion and neck supple.  Skin:    General: Skin is warm and dry.  Neurological:     Mental Status: He is alert and oriented to person, place, and time.  Psychiatric:        Mood and Affect: Mood normal.        Behavior: Behavior normal.        Thought Content: Thought content normal.        Judgment: Judgment normal.     Assessment & Plan   Gen was seen today for hypertension.  Diagnoses and all orders for this visit:  Lumbosacral radiculopathy at L5 When insurance is straightened now he will be referred to a spinal specialist.  To be determined by patient  Patient have been counseled extensively about nutrition and exercise. Other issues discussed during this visit include: low cholesterol diet, weight control and daily exercise, foot care, annual eye examinations at Ophthalmology, importance of adherence with medications and regular follow-up. We also discussed long term complications of uncontrolled diabetes and hypertension.  The patient was given clear instructions to go to ER or return to medical center if symptoms don't improve, worsen or new problems develop. The patient verbalized understanding. The patient was told to call to get lab results if they haven't heard anything in the next week.   This note has been created with Education officer, environmental. Any transcriptional errors are unintentional.   Rosaline SHAUNNA Bohr, NP 06/21/2024, 11:01 AM this

## 2024-07-15 ENCOUNTER — Ambulatory Visit (INDEPENDENT_AMBULATORY_CARE_PROVIDER_SITE_OTHER): Admitting: Primary Care

## 2024-07-22 ENCOUNTER — Other Ambulatory Visit (INDEPENDENT_AMBULATORY_CARE_PROVIDER_SITE_OTHER): Payer: Self-pay | Admitting: Primary Care
